# Patient Record
Sex: Male | Born: 1965 | Race: White | Hispanic: No | Marital: Married | Smoking: Never smoker
Health system: Southern US, Community
[De-identification: ages and names within clinical notes are randomized; demographics above are authoritative.]

## PROBLEM LIST (undated history)

## (undated) DIAGNOSIS — E119 Type 2 diabetes mellitus without complications: Secondary | ICD-10-CM

## (undated) DIAGNOSIS — K8020 Calculus of gallbladder without cholecystitis without obstruction: Secondary | ICD-10-CM

## (undated) DIAGNOSIS — I10 Essential (primary) hypertension: Secondary | ICD-10-CM

## (undated) HISTORY — DX: Calculus of gallbladder without cholecystitis without obstruction: K80.20

## (undated) HISTORY — DX: Type 2 diabetes mellitus without complications: E11.9

## (undated) HISTORY — DX: Essential (primary) hypertension: I10

---

## 2003-02-02 DIAGNOSIS — Z86018 Personal history of other benign neoplasm: Secondary | ICD-10-CM

## 2003-02-02 HISTORY — DX: Personal history of other benign neoplasm: Z86.018

## 2004-05-22 ENCOUNTER — Emergency Department (HOSPITAL_COMMUNITY): Admission: EM | Admit: 2004-05-22 | Discharge: 2004-05-22 | Payer: Self-pay | Admitting: *Deleted

## 2009-08-14 DIAGNOSIS — D239 Other benign neoplasm of skin, unspecified: Secondary | ICD-10-CM

## 2009-08-14 HISTORY — DX: Other benign neoplasm of skin, unspecified: D23.9

## 2010-10-19 DIAGNOSIS — Z85828 Personal history of other malignant neoplasm of skin: Secondary | ICD-10-CM

## 2010-10-19 HISTORY — DX: Personal history of other malignant neoplasm of skin: Z85.828

## 2015-03-13 DIAGNOSIS — L821 Other seborrheic keratosis: Secondary | ICD-10-CM | POA: Diagnosis not present

## 2015-03-13 DIAGNOSIS — D1800 Hemangioma unspecified site: Secondary | ICD-10-CM | POA: Diagnosis not present

## 2015-03-13 DIAGNOSIS — Z85828 Personal history of other malignant neoplasm of skin: Secondary | ICD-10-CM | POA: Diagnosis not present

## 2015-03-13 DIAGNOSIS — L918 Other hypertrophic disorders of the skin: Secondary | ICD-10-CM | POA: Diagnosis not present

## 2015-03-13 DIAGNOSIS — L820 Inflamed seborrheic keratosis: Secondary | ICD-10-CM | POA: Diagnosis not present

## 2015-03-13 DIAGNOSIS — D485 Neoplasm of uncertain behavior of skin: Secondary | ICD-10-CM | POA: Diagnosis not present

## 2015-03-13 DIAGNOSIS — D229 Melanocytic nevi, unspecified: Secondary | ICD-10-CM | POA: Diagnosis not present

## 2015-03-13 DIAGNOSIS — Z1283 Encounter for screening for malignant neoplasm of skin: Secondary | ICD-10-CM | POA: Diagnosis not present

## 2015-03-13 DIAGNOSIS — L578 Other skin changes due to chronic exposure to nonionizing radiation: Secondary | ICD-10-CM | POA: Diagnosis not present

## 2015-03-28 DIAGNOSIS — H524 Presbyopia: Secondary | ICD-10-CM | POA: Diagnosis not present

## 2015-12-01 ENCOUNTER — Other Ambulatory Visit: Payer: Self-pay | Admitting: Internal Medicine

## 2015-12-01 DIAGNOSIS — R7989 Other specified abnormal findings of blood chemistry: Secondary | ICD-10-CM

## 2015-12-01 DIAGNOSIS — R945 Abnormal results of liver function studies: Secondary | ICD-10-CM

## 2015-12-05 ENCOUNTER — Ambulatory Visit
Admission: RE | Admit: 2015-12-05 | Discharge: 2015-12-05 | Disposition: A | Discharge status: Home / Self Care | Payer: BLUE CROSS/BLUE SHIELD | Source: Ambulatory Visit | Attending: Internal Medicine | Admitting: Internal Medicine

## 2015-12-05 DIAGNOSIS — R7989 Other specified abnormal findings of blood chemistry: Secondary | ICD-10-CM | POA: Insufficient documentation

## 2015-12-05 DIAGNOSIS — R945 Abnormal results of liver function studies: Secondary | ICD-10-CM

## 2015-12-24 ENCOUNTER — Encounter: Payer: Self-pay | Admitting: *Deleted

## 2015-12-24 ENCOUNTER — Encounter: Payer: BLUE CROSS/BLUE SHIELD | Attending: Internal Medicine | Admitting: *Deleted

## 2015-12-24 VITALS — BP 142/96 | Ht 73.0 in | Wt 267.4 lb

## 2015-12-24 DIAGNOSIS — Z713 Dietary counseling and surveillance: Secondary | ICD-10-CM | POA: Diagnosis present

## 2015-12-24 DIAGNOSIS — E119 Type 2 diabetes mellitus without complications: Secondary | ICD-10-CM | POA: Insufficient documentation

## 2015-12-24 NOTE — Progress Notes (Signed)
Diabetes Self-Management Education  Visit Type: First/Initial  Appt. Start Time: 1030 Appt. End Time: [id number]  12/24/2015  Randy Hahn, identified by name and date of birth, is a 50 y.o. male with a diagnosis of Diabetes: Type 2.   ASSESSMENT  Blood pressure (!) 142/96, height 6\' 1"  (1.854 m), weight 267 lb 6.4 oz (121.3 kg). Body mass index is 35.28 kg/m.      Diabetes Self-Management Education - 12/24/15 1218      Visit Information   Visit Type First/Initial     Initial Visit   Diabetes Type Type 2   Are you currently following a meal plan? Yes   What type of meal plan do you follow? "cutting carbs/sugars"   Are you taking your medications as prescribed? Yes   Date Diagnosed "3 weeks"     Health Coping   How would you rate your overall health? Fair     Psychosocial Assessment   Patient Belief/Attitude about Diabetes Motivated to manage diabetes  "angry"   Self-care barriers None   Self-management support Doctor's office;Family   Patient Concerns Nutrition/Meal planning;Medication;Glycemic Control;Weight Control;Healthy Lifestyle   Special Needs None   Preferred Learning Style Visual   Learning Readiness Change in progress   How often do you need to have someone help you when you read instructions, pamphlets, or other written materials from your doctor or pharmacy? 1 - Never   What is the last grade level you completed in school? Masters     Pre-Education Assessment   Patient understands the diabetes disease and treatment process. Needs Instruction   Patient understands incorporating nutritional management into lifestyle. Needs Instruction   Patient undertands incorporating physical activity into lifestyle. Needs Instruction   Patient understands using medications safely. Needs Instruction   Patient understands monitoring blood glucose, interpreting and using results Needs Review   Patient understands prevention, detection, and treatment of acute  complications. Needs Instruction   Patient understands prevention, detection, and treatment of chronic complications. Needs Instruction   Patient understands how to develop strategies to address psychosocial issues. Needs Instruction   Patient understands how to develop strategies to promote health/change behavior. Needs Instruction     Complications   Last HgB A1C per patient/outside source 11.2 %   How often do you check your blood sugar? 1-2 times/day   Fasting Blood glucose range (mg/dL) 130-179  FBG's 134-174 mg/dL   Postprandial Blood glucose range (mg/dL) 70-129;130-179  pp's 100-178 mg/dL   Have you had a dilated eye exam in the past 12 months? Yes   Have you had a dental exam in the past 12 months? Yes   Are you checking your feet? No     Dietary Intake   Breakfast eggs, cheese, sausage on English muffin   Snack (morning) cereal bars, chips, nuts, fruit   Lunch salad or wrap; protien bars, peanut butter crackers   Snack (afternoon) same as morning snack   Dinner meat, vegetables - green beans   Snack (evening) Greek yogurt   Beverage(s) water, diet soda     Exercise   Exercise Type Light (walking / raking leaves)   How many days per week to you exercise? 3   How many minutes per day do you exercise? 30   Total minutes per week of exercise 90     Patient Education   Previous Diabetes Education No   Disease state  Definition of diabetes, type 1 and 2, and the diagnosis of diabetes   Nutrition management  Role of diet in the treatment of diabetes and the relationship between the three main macronutrients and blood glucose level;Carbohydrate counting   Physical activity and exercise  Role of exercise on diabetes management, blood pressure control and cardiac health.   Medications Reviewed patients medication for diabetes, action, purpose, timing of dose and side effects.   Monitoring Purpose and frequency of SMBG.;Taught/discussed recording of test results and interpretation  of SMBG.;Identified appropriate SMBG and/or A1C goals.   Chronic complications Relationship between chronic complications and blood glucose control   Psychosocial adjustment Identified and addressed patients feelings and concerns about diabetes     Individualized Goals (developed by patient)   Reducing Risk Improve blood sugars Decrease medications Lose weight Lead a healthier lifestyle Become more fit     Outcomes   Expected Outcomes Demonstrated interest in learning. Expect positive outcomes   Future DMSE 4-6 wks      Individualized Plan for Diabetes Self-Management Training:   Learning Objective:  Patient will have a greater understanding of diabetes self-management. Patient education plan is to attend individual and/or group sessions per assessed needs and concerns.   Plan:   Patient Instructions  Check blood sugars 2 x day before breakfast and 2 hrs after supper every day Exercise: Continue walking for  20-30  minutes  3-4 days a week and gradually increase to 150 minutes/week Eat 3 meals day,  2-3  snacks a day Space meals 4-6 hours apart Bring blood sugar records to the next class   Expected Outcomes:  Demonstrated interest in learning. Expect positive outcomes  Education material provided:  General Meal Planning Guidelines Simple Meal Plan  If problems or questions, patient to contact team via:  Johny Drilling, RN, [place], CDE [phone]  Future DSME appointment: 4-6 wks  February 09, 2016 for Diabetes Class 1

## 2015-12-24 NOTE — Patient Instructions (Signed)
Check blood sugars 2 x day before breakfast and 2 hrs after supper every day  Exercise: Continue walking for  20-30  minutes  3-4 days a week and gradually increase to 150 minutes/week  Eat 3 meals day,  2-3  snacks a day Space meals 4-6 hours apart  Bring blood sugar records to the next class  Return for classes on:

## 2016-02-12 ENCOUNTER — Encounter: Payer: BLUE CROSS/BLUE SHIELD | Attending: Internal Medicine | Admitting: Dietician

## 2016-02-12 ENCOUNTER — Encounter: Payer: Self-pay | Admitting: Dietician

## 2016-02-12 VITALS — Ht 73.0 in | Wt 269.4 lb

## 2016-02-12 DIAGNOSIS — Z713 Dietary counseling and surveillance: Secondary | ICD-10-CM | POA: Diagnosis present

## 2016-02-12 DIAGNOSIS — E119 Type 2 diabetes mellitus without complications: Secondary | ICD-10-CM | POA: Diagnosis not present

## 2016-02-12 NOTE — Progress Notes (Signed)

## 2016-02-16 ENCOUNTER — Encounter: Payer: BLUE CROSS/BLUE SHIELD | Admitting: Dietician

## 2016-02-16 ENCOUNTER — Encounter: Payer: Self-pay | Admitting: Dietician

## 2016-02-16 VITALS — Wt 265.3 lb

## 2016-02-16 DIAGNOSIS — E119 Type 2 diabetes mellitus without complications: Secondary | ICD-10-CM

## 2016-02-16 DIAGNOSIS — Z713 Dietary counseling and surveillance: Secondary | ICD-10-CM | POA: Diagnosis not present

## 2016-02-16 NOTE — Progress Notes (Signed)

## 2016-02-25 ENCOUNTER — Telehealth: Payer: Self-pay | Admitting: Dietician

## 2016-02-25 NOTE — Telephone Encounter (Signed)
Called patient to reschedule class 3, which he missed on 02/23/16. Left a voicemail message offering next class 3 on 03/22/16, and requested a call back.

## 2016-03-30 ENCOUNTER — Encounter: Payer: Self-pay | Admitting: *Deleted

## 2018-02-02 IMAGING — US US ABDOMEN LIMITED
1 series · 13 of 25 positions shown · non-contrast
Comparison: None.

CLINICAL DATA: Abnormal liver enzymes

EXAM:
US ABDOMEN LIMITED - RIGHT UPPER QUADRANT

[Series 1: us abdomen limited · 0.24mm/px · 13 of 55 slices shown]
[im 1/55]
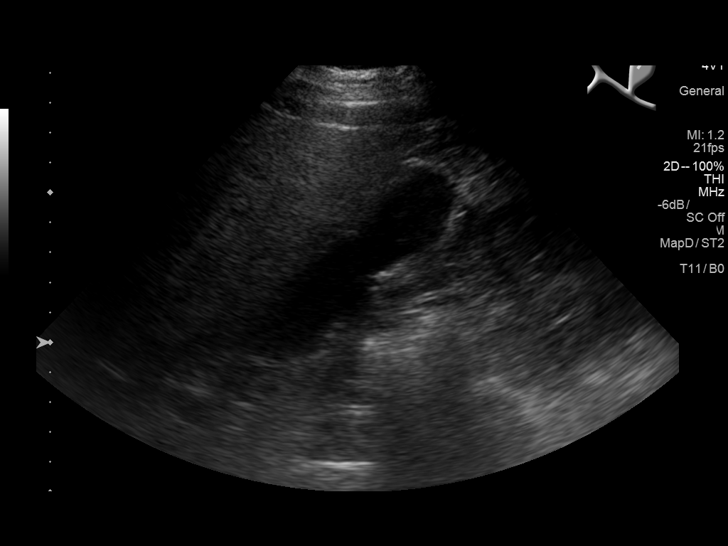
[im 5/55]
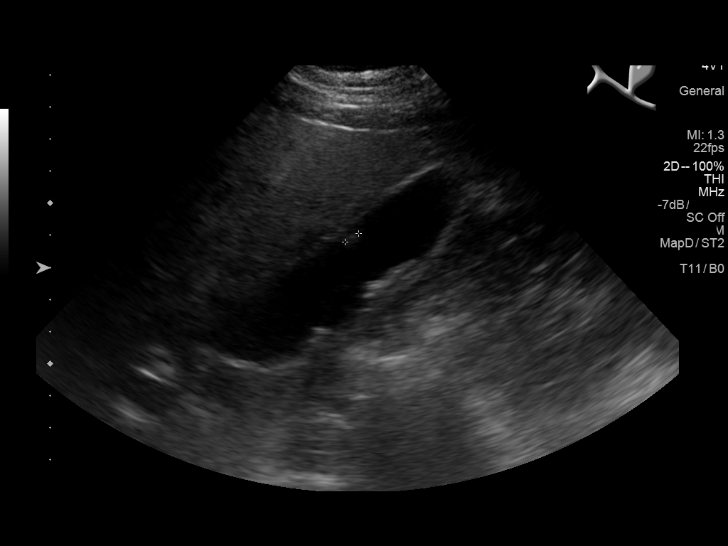
[im 10/55]
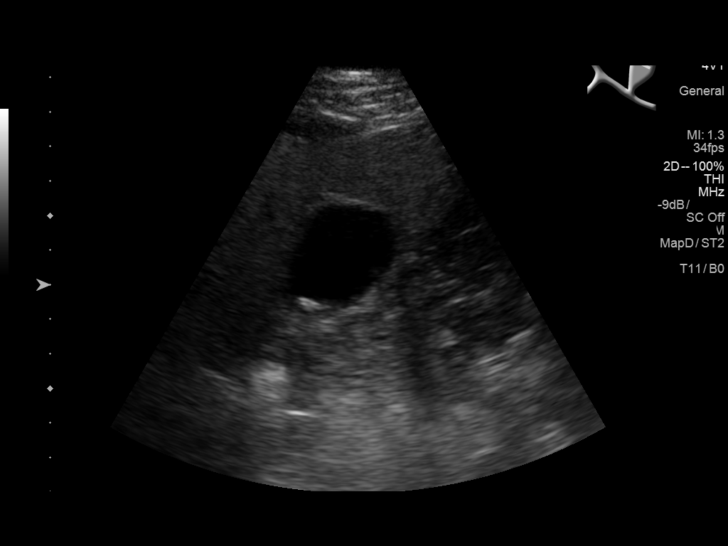
[im 14/55]
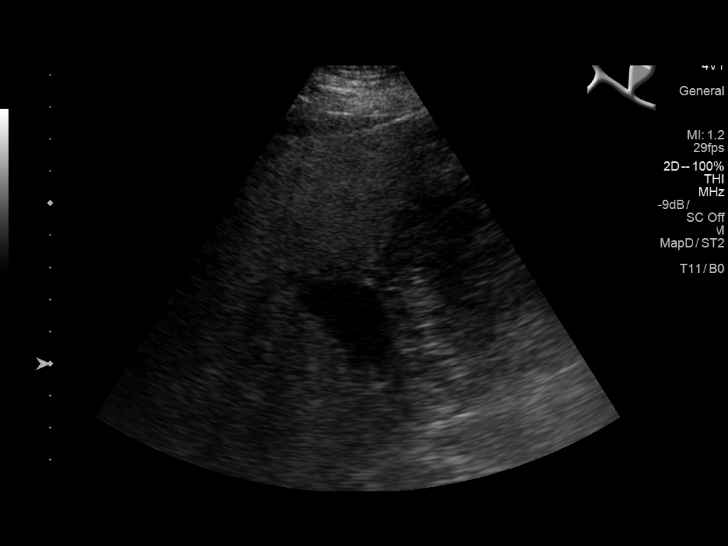
[im 19/55]
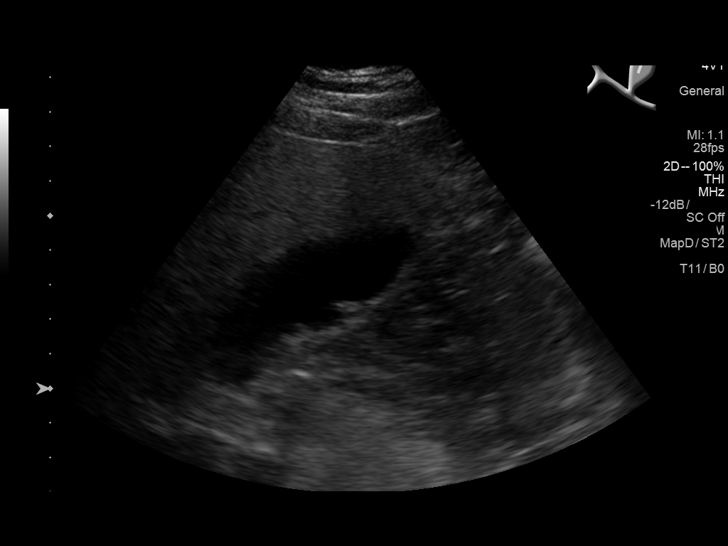
[im 23/55]
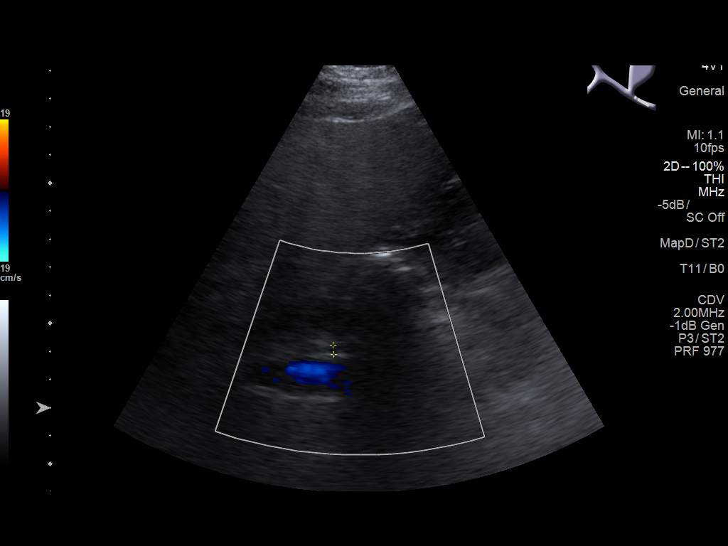
[im 28/55]
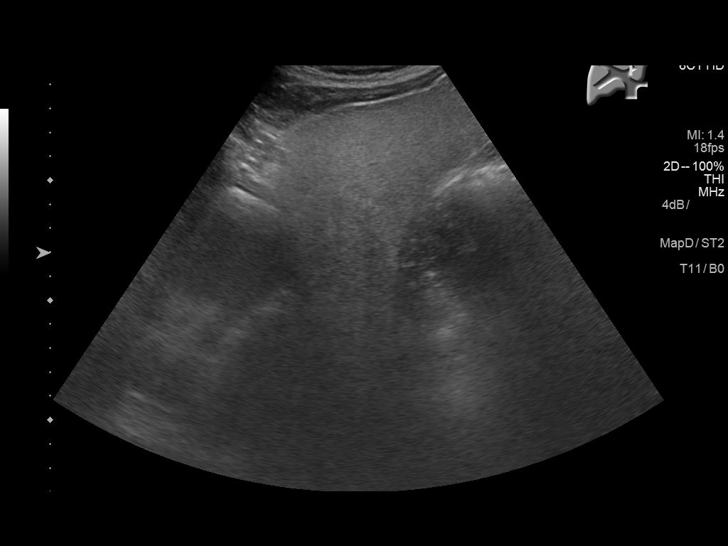
[im 32/55]
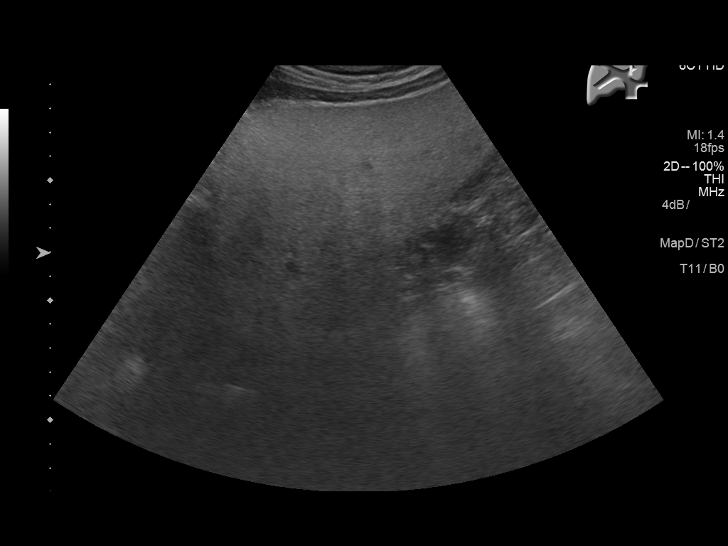
[im 37/55]
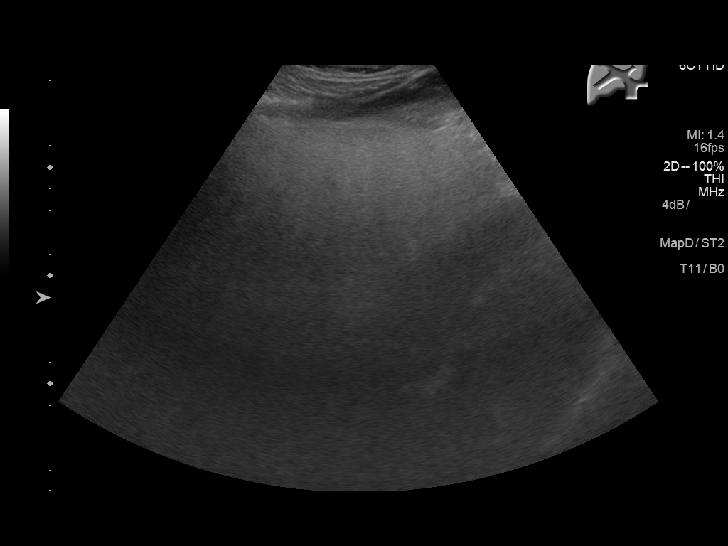
[im 41/55]
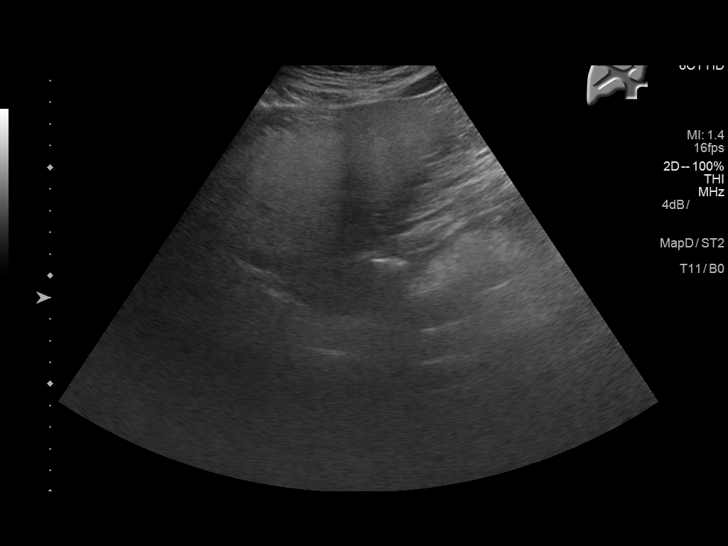
[im 46/55]
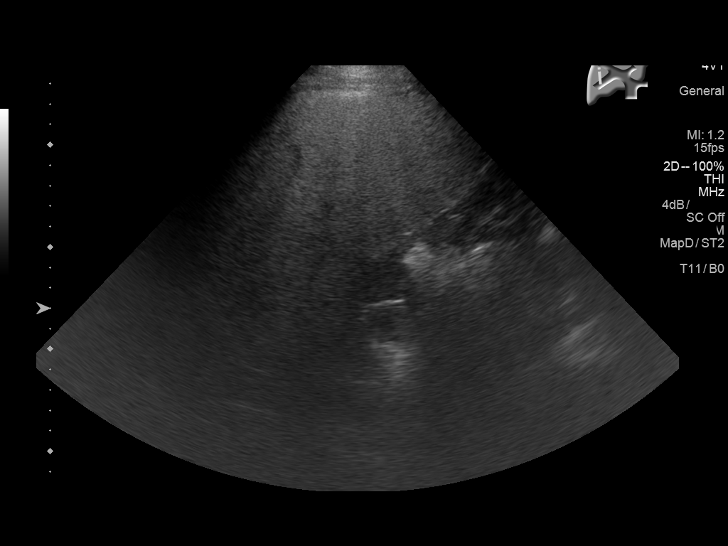
[im 50/55]
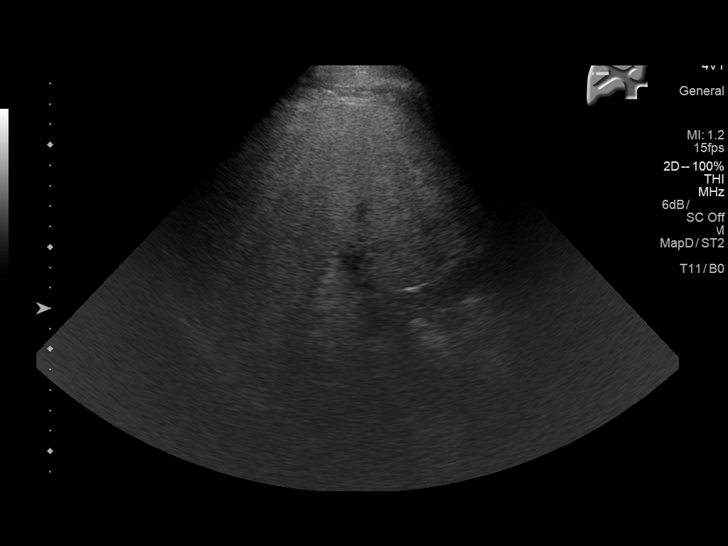
[im 55/55]
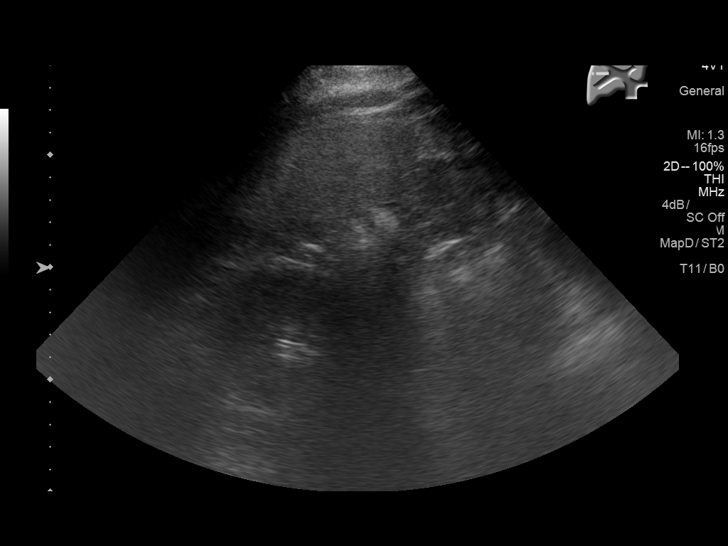

[13 of 25 positions shown; findings below may reference images not displayed]

FINDINGS: Gallbladder:

Within the gallbladder, there is an echogenic focus which shadows,
likely an adherent gallstone. There is a non shadowing echogenic
focus in the gallbladder which does not move measuring 6 mm, felt to
represent a polyp. Several 3-4 mm echogenic foci which do not move
or shadow are also seen, felt to represent polyps. No gallbladder
wall thickening or pericholecystic fluid. No sonographic Murphy sign
noted by sonographer.

Common bile duct:

Diameter: 3 mm. No intrahepatic or extrahepatic biliary duct
dilatation.

Liver:

No focal lesion identified. There is diffusely increased liver
echogenicity.
IMPRESSION: Several polypoid areas in the gallbladder, largest measuring 6 mm.
There is also a probable adherent gallstone in the gallbladder. The
gallbladder wall is not appreciably thickened. Based on polyp size,
specific recommendation is not made per consensus guidelines.

There is marked increased echogenicity in the liver, likely due to
diffuse hepatic steatosis. While no focal liver lesions are
identified, it must be cautioned that the sensitivity of ultrasound
for focal liver lesions is diminished in this circumstance.

## 2018-02-14 DIAGNOSIS — C439 Malignant melanoma of skin, unspecified: Secondary | ICD-10-CM

## 2018-02-14 DIAGNOSIS — Z85820 Personal history of malignant melanoma of skin: Secondary | ICD-10-CM

## 2018-02-14 HISTORY — DX: Personal history of malignant melanoma of skin: Z85.820

## 2018-02-14 HISTORY — DX: Malignant melanoma of skin, unspecified: C43.9

## 2019-06-11 ENCOUNTER — Other Ambulatory Visit: Payer: Self-pay

## 2019-06-11 ENCOUNTER — Encounter: Payer: Self-pay | Admitting: Dermatology

## 2019-06-11 ENCOUNTER — Ambulatory Visit (INDEPENDENT_AMBULATORY_CARE_PROVIDER_SITE_OTHER): Payer: BC Managed Care – PPO | Admitting: Dermatology

## 2019-06-11 DIAGNOSIS — D229 Melanocytic nevi, unspecified: Secondary | ICD-10-CM

## 2019-06-11 DIAGNOSIS — G629 Polyneuropathy, unspecified: Secondary | ICD-10-CM

## 2019-06-11 DIAGNOSIS — L814 Other melanin hyperpigmentation: Secondary | ICD-10-CM

## 2019-06-11 DIAGNOSIS — L821 Other seborrheic keratosis: Secondary | ICD-10-CM

## 2019-06-11 DIAGNOSIS — L309 Dermatitis, unspecified: Secondary | ICD-10-CM | POA: Diagnosis not present

## 2019-06-11 DIAGNOSIS — Z1283 Encounter for screening for malignant neoplasm of skin: Secondary | ICD-10-CM | POA: Diagnosis not present

## 2019-06-11 DIAGNOSIS — L910 Hypertrophic scar: Secondary | ICD-10-CM

## 2019-06-11 DIAGNOSIS — Z85820 Personal history of malignant melanoma of skin: Secondary | ICD-10-CM

## 2019-06-11 DIAGNOSIS — E119 Type 2 diabetes mellitus without complications: Secondary | ICD-10-CM

## 2019-06-11 DIAGNOSIS — Z86018 Personal history of other benign neoplasm: Secondary | ICD-10-CM

## 2019-06-11 DIAGNOSIS — L578 Other skin changes due to chronic exposure to nonionizing radiation: Secondary | ICD-10-CM

## 2019-06-11 DIAGNOSIS — D2239 Melanocytic nevi of other parts of face: Secondary | ICD-10-CM

## 2019-06-11 MED ORDER — MOMETASONE FUROATE 0.1 % EX CREA: TOPICAL_CREAM | CUTANEOUS | 1 refills | Status: DC

## 2019-06-11 NOTE — Progress Notes (Signed)
   Follow-Up Visit   Subjective  Randy Hahn is a 54 y.o. male who presents for the following: Annual Exam (Hx MM, and dysplastic nevi) and rash (itchy dry patches on the stomach and back - patient using OTC lotions). The patient presents for total-body skin exam for skin cancer screening and mole check.  The following portions of the chart were reviewed this encounter and updated as appropriate:  Tobacco  Allergies  Meds  Problems  Med Hx  Surg Hx  Fam Hx     Review of Systems:  No other skin or systemic complaints except as noted in HPI or Assessment and Plan.  Objective  Well appearing patient in no apparent distress; mood and affect are within normal limits.  A full examination was performed including scalp, head, eyes, ears, nose, lips, neck, chest, axillae, abdomen, back, buttocks, bilateral upper extremities, bilateral lower extremities, hands, feet, fingers, toes, fingernails, and toenails. All findings within normal limits unless otherwise noted below.  Objective  Trunk: Patchy pinkness and dyschromia mostly on the abdomen   Objective  back: Dyspigmented smooth macule or patch.   Objective  dorsum nose: 0.3 cm flesh colored papule   Assessment & Plan  Eczema, unspecified type Trunk  Recommend mild soaps and moisturizers like Dove and CeraVe cream. Start Mometasone 0.1% cream apply to aa's QD-BID PRN.   mometasone (ELOCON) 0.1 % cream - Trunk  Hypertrophic scar back  S/P Cyst removal - about 20 years ago from a general surgeon  Neuropathy R toe  Patient is type 2 diabetic, not related to MM surgery - patient to follow up with Dr. Sabra Heck in June.   Nevus dorsum nose  Vs fibrous papule - Benign, observe.     Seborrheic Keratoses - Stuck-on, waxy, tan-brown papules and plaques  - Discussed benign etiology and prognosis. - Observe - Call for any changes  Lentigines - Scattered tan macules - Discussed due to sun exposure - Benign,  observe - Call for any changes  Actinic Damage - diffuse scaly erythematous macules with underlying dyspigmentation - Recommend daily broad spectrum sunscreen SPF 30+ to sun-exposed areas, reapply every 2 hours as needed.  - Call for new or changing lesions.  Hemangiomas - Red papules - Discussed benign nature - Observe - Call for any changes  Melanocytic Nevi - Tan-brown and/or pink-flesh-colored symmetric macules and papules - Benign appearing on exam today - Observation - Call clinic for new or changing moles - Recommend daily use of broad spectrum spf 30+ sunscreen to sun-exposed areas.   History of Melanoma - No evidence of recurrence today - No lymphadenopathy - Recommend regular full body skin exams - Recommend daily broad spectrum sunscreen SPF 30+ to sun-exposed areas, reapply every 2 hours as needed.  - Call if any new or changing lesions are noted between office visits  Skin cancer screening performed today.  Return in about 4 months (around 10/12/2019) for TBSE.  [name], CMA, am acting as scribe for [name], MD . Documentation: I have reviewed the above documentation for accuracy and completeness, and I agree with the above.  [name], MD

## 2019-06-11 NOTE — Patient Instructions (Signed)

## 2019-06-26 ENCOUNTER — Encounter: Payer: Self-pay | Admitting: Dermatology

## 2019-10-04 ENCOUNTER — Ambulatory Visit (INDEPENDENT_AMBULATORY_CARE_PROVIDER_SITE_OTHER): Payer: No Typology Code available for payment source | Admitting: Dermatology

## 2019-10-04 ENCOUNTER — Other Ambulatory Visit: Payer: Self-pay

## 2019-10-04 ENCOUNTER — Encounter: Payer: Self-pay | Admitting: Dermatology

## 2019-10-04 DIAGNOSIS — L821 Other seborrheic keratosis: Secondary | ICD-10-CM

## 2019-10-04 DIAGNOSIS — D225 Melanocytic nevi of trunk: Secondary | ICD-10-CM | POA: Diagnosis not present

## 2019-10-04 DIAGNOSIS — L578 Other skin changes due to chronic exposure to nonionizing radiation: Secondary | ICD-10-CM

## 2019-10-04 DIAGNOSIS — Z1283 Encounter for screening for malignant neoplasm of skin: Secondary | ICD-10-CM

## 2019-10-04 DIAGNOSIS — Z85828 Personal history of other malignant neoplasm of skin: Secondary | ICD-10-CM

## 2019-10-04 DIAGNOSIS — R21 Rash and other nonspecific skin eruption: Secondary | ICD-10-CM | POA: Diagnosis not present

## 2019-10-04 DIAGNOSIS — Z85820 Personal history of malignant melanoma of skin: Secondary | ICD-10-CM

## 2019-10-04 DIAGNOSIS — D229 Melanocytic nevi, unspecified: Secondary | ICD-10-CM

## 2019-10-04 DIAGNOSIS — D1800 Hemangioma unspecified site: Secondary | ICD-10-CM

## 2019-10-04 DIAGNOSIS — L814 Other melanin hyperpigmentation: Secondary | ICD-10-CM

## 2019-10-04 NOTE — Progress Notes (Signed)
   Follow-Up Visit   Subjective  Randy Hahn is a 54 y.o. male who presents for the following: Annual Exam (Hx MM, dysplastic nevi - patient has a rash that started last week after working in the yard. He is using Mometasone cream which does seem to help).  The following portions of the chart were reviewed this encounter and updated as appropriate:  Tobacco  Allergies  Meds  Problems  Med Hx  Surg Hx  Fam Hx     Review of Systems:  No other skin or systemic complaints except as noted in HPI or Assessment and Plan.  Objective  Well appearing patient in no apparent distress; mood and affect are within normal limits.  A full examination was performed including scalp, head, eyes, ears, nose, lips, neck, chest, axillae, abdomen, back, buttocks, bilateral upper extremities, bilateral lower extremities, hands, feet, fingers, toes, fingernails, and toenails. All findings within normal limits unless otherwise noted below.  Objective  R post waistline: Re-pigmented brown macule  Assessment & Plan  Rash and other nonspecific skin eruption - Eczema vs Contact Dermatitis - flared Right Forearm - Anterior Eczema (vs Contact) of the right arm with flare - continue Mometasone 0.1% cream to aa BID until clear.   Nevus R post waistline repigmented Biopsy proven benign - 07/20/2018   Lentigines - Scattered tan macules - Discussed due to sun exposure - Benign, observe - Call for any changes  Seborrheic Keratoses - Stuck-on, waxy, tan-brown papules and plaques  - Discussed benign etiology and prognosis. - Observe - Call for any changes  Melanocytic Nevi - Tan-brown and/or pink-flesh-colored symmetric macules and papules - Benign appearing on exam today - Observation - Call clinic for new or changing moles - Recommend daily use of broad spectrum spf 30+ sunscreen to sun-exposed areas.   Hemangiomas - Red papules - Discussed benign nature - Observe - Call for any  changes  Actinic Damage - diffuse scaly erythematous macules with underlying dyspigmentation - Recommend daily broad spectrum sunscreen SPF 30+ to sun-exposed areas, reapply every 2 hours as needed.  - Call for new or changing lesions.  History of Melanoma with superficial spreading - R med ant ankle above the med malleous (02/14/2018)  - No evidence of recurrence today - No lymphadenopathy - Recommend regular full body skin exams - Recommend daily broad spectrum sunscreen SPF 30+ to sun-exposed areas, reapply every 2 hours as needed.  - Call if any new or changing lesions are noted between office visits  History of Basal Cell Carcinoma of the Skin - R lat deltoid (10/19/2010) - No evidence of recurrence today - Recommend regular full body skin exams - Recommend daily broad spectrum sunscreen SPF 30+ to sun-exposed areas, reapply every 2 hours as needed.  - Call if any new or changing lesions are noted between office visits  Skin cancer screening performed today.  Return in about 4 months (around 02/03/2020) for TBSE - hx MM, dysplastic nevi .  [name], CMA, am acting as scribe for [name], MD .  Documentation: I have reviewed the above documentation for accuracy and completeness, and I agree with the above.  [name], MD

## 2019-10-10 ENCOUNTER — Encounter: Payer: Self-pay | Admitting: Dermatology

## 2020-02-05 ENCOUNTER — Ambulatory Visit (INDEPENDENT_AMBULATORY_CARE_PROVIDER_SITE_OTHER): Payer: No Typology Code available for payment source | Admitting: Dermatology

## 2020-02-05 ENCOUNTER — Encounter: Payer: Self-pay | Admitting: Dermatology

## 2020-02-05 ENCOUNTER — Other Ambulatory Visit: Payer: Self-pay

## 2020-02-05 DIAGNOSIS — Z85820 Personal history of malignant melanoma of skin: Secondary | ICD-10-CM

## 2020-02-05 DIAGNOSIS — L810 Postinflammatory hyperpigmentation: Secondary | ICD-10-CM | POA: Diagnosis not present

## 2020-02-05 DIAGNOSIS — Z1283 Encounter for screening for malignant neoplasm of skin: Secondary | ICD-10-CM

## 2020-02-05 DIAGNOSIS — Z85828 Personal history of other malignant neoplasm of skin: Secondary | ICD-10-CM

## 2020-02-05 DIAGNOSIS — D1800 Hemangioma unspecified site: Secondary | ICD-10-CM

## 2020-02-05 DIAGNOSIS — Z86018 Personal history of other benign neoplasm: Secondary | ICD-10-CM

## 2020-02-05 DIAGNOSIS — L720 Epidermal cyst: Secondary | ICD-10-CM | POA: Diagnosis not present

## 2020-02-05 DIAGNOSIS — L814 Other melanin hyperpigmentation: Secondary | ICD-10-CM

## 2020-02-05 DIAGNOSIS — L578 Other skin changes due to chronic exposure to nonionizing radiation: Secondary | ICD-10-CM

## 2020-02-05 DIAGNOSIS — L820 Inflamed seborrheic keratosis: Secondary | ICD-10-CM | POA: Diagnosis not present

## 2020-02-05 DIAGNOSIS — D229 Melanocytic nevi, unspecified: Secondary | ICD-10-CM

## 2020-02-05 DIAGNOSIS — L821 Other seborrheic keratosis: Secondary | ICD-10-CM

## 2020-02-05 NOTE — Progress Notes (Signed)
Follow-Up Visit   Subjective  Randy Hahn is a 55 y.o. male who presents for the following: Annual Exam (Hx MM, Hx BCC, Hx dysplastic nevi). Patient has noticed a lesion on his R lower leg that he scratches at, and a hx of an inflamed cyst that he would like checked today.  The patient presents for Total-Body Skin Exam (TBSE) for skin cancer screening and mole check.  The following portions of the chart were reviewed this encounter and updated as appropriate:   Tobacco  Allergies  Meds  Problems  Med Hx  Surg Hx  Fam Hx     Review of Systems:  No other skin or systemic complaints except as noted in HPI or Assessment and Plan.  Objective  Well appearing patient in no apparent distress; mood and affect are within normal limits.  A full examination was performed including scalp, head, eyes, ears, nose, lips, neck, chest, axillae, abdomen, back, buttocks, bilateral upper extremities, bilateral lower extremities, hands, feet, fingers, toes, fingernails, and toenails. All findings within normal limits unless otherwise noted below.  Objective  R lower leg x 2 (2): Erythematous keratotic or waxy stuck-on papule or plaque.   Objective  R ant waistline: Violaceous macule.  Assessment & Plan  Inflamed seborrheic keratosis (2) R lower leg x 2  Destruction of lesion - R lower leg x 2 Complexity: simple   Destruction method: cryotherapy   Informed consent: discussed and consent obtained   Timeout:  patient name, date of birth, surgical site, and procedure verified Lesion destroyed using liquid nitrogen: Yes   Region frozen until ice ball extended beyond lesion: Yes   Outcome: patient tolerated procedure well with no complications   Post-procedure details: wound care instructions given    Ruptured epidermal cyst R ant waistline  With PIPA - Discussed risk of recurrence and surgery if necessary.   Lentigines - Scattered tan macules - Discussed due to sun exposure -  Benign, observe - Call for any changes  Seborrheic Keratoses - Stuck-on, waxy, tan-brown papules and plaques  - Discussed benign etiology and prognosis. - Observe - Call for any changes  Melanocytic Nevi - Tan-brown and/or pink-flesh-colored symmetric macules and papules - Benign appearing on exam today - Observation - Call clinic for new or changing moles - Recommend daily use of broad spectrum spf 30+ sunscreen to sun-exposed areas.   Hemangiomas - Red papules - Discussed benign nature - Observe - Call for any changes  Actinic Damage - Chronic, secondary to cumulative UV/sun exposure - diffuse scaly erythematous macules with underlying dyspigmentation - Recommend daily broad spectrum sunscreen SPF 30+ to sun-exposed areas, reapply every 2 hours as needed.  - Call for new or changing lesions.  History of Basal Cell Carcinoma of the Skin - R lat deltoid (2012) - No evidence of recurrence today - Recommend regular full body skin exams - Recommend daily broad spectrum sunscreen SPF 30+ to sun-exposed areas, reapply every 2 hours as needed.  - Call if any new or changing lesions are noted between office visits  History of Melanoma - R med ant ankle above the med malleous. Superficial spreading. Clark's level II/III. Breslow's 0.51mm (02/14/2018) - No evidence of recurrence today - No lymphadenopathy - Recommend regular full body skin exams - Recommend daily broad spectrum sunscreen SPF 30+ to sun-exposed areas, reapply every 2 hours as needed.  - Call if any new or changing lesions are noted between office visits  History of Dysplastic Nevi - numerous -  No evidence of recurrence today - Recommend regular full body skin exams - Recommend daily broad spectrum sunscreen SPF 30+ to sun-exposed areas, reapply every 2 hours as needed.  - Call if any new or changing lesions are noted between office visits  Skin cancer screening performed today.  Return in about 6 months (around  08/04/2020) for TBSE - hx MM, dysplastic nevi, BCC.  Maylene Roes, CMA, am acting as scribe for Armida Sans, MD .  Documentation: I have reviewed the above documentation for accuracy and completeness, and I agree with the above.  Armida Sans, MD

## 2020-02-05 NOTE — Patient Instructions (Signed)
Recommend taking Heliocare sun protection supplement daily in sunny weather for additional sun protection. For maximum protection on the sunniest days, you can take up to 2 capsules of regular Heliocare OR take 1 capsule of Heliocare Ultra. For prolonged exposure (such as a full day in the sun), you can repeat your dose of the supplement 4 hours after your first dose. Heliocare can be purchased at [facility] or at [email or web address].       

## 2020-04-29 ENCOUNTER — Other Ambulatory Visit: Payer: Self-pay | Admitting: Dermatology

## 2020-04-29 DIAGNOSIS — L309 Dermatitis, unspecified: Secondary | ICD-10-CM

## 2020-08-20 ENCOUNTER — Ambulatory Visit (INDEPENDENT_AMBULATORY_CARE_PROVIDER_SITE_OTHER): Payer: No Typology Code available for payment source | Admitting: Dermatology

## 2020-08-20 ENCOUNTER — Other Ambulatory Visit: Payer: Self-pay

## 2020-08-20 DIAGNOSIS — Z1283 Encounter for screening for malignant neoplasm of skin: Secondary | ICD-10-CM | POA: Diagnosis not present

## 2020-08-20 DIAGNOSIS — L814 Other melanin hyperpigmentation: Secondary | ICD-10-CM

## 2020-08-20 DIAGNOSIS — Z86018 Personal history of other benign neoplasm: Secondary | ICD-10-CM

## 2020-08-20 DIAGNOSIS — D1800 Hemangioma unspecified site: Secondary | ICD-10-CM

## 2020-08-20 DIAGNOSIS — L821 Other seborrheic keratosis: Secondary | ICD-10-CM

## 2020-08-20 DIAGNOSIS — D229 Melanocytic nevi, unspecified: Secondary | ICD-10-CM

## 2020-08-20 DIAGNOSIS — Z85820 Personal history of malignant melanoma of skin: Secondary | ICD-10-CM | POA: Diagnosis not present

## 2020-08-20 DIAGNOSIS — D2239 Melanocytic nevi of other parts of face: Secondary | ICD-10-CM

## 2020-08-20 DIAGNOSIS — Z85828 Personal history of other malignant neoplasm of skin: Secondary | ICD-10-CM | POA: Diagnosis not present

## 2020-08-20 DIAGNOSIS — L578 Other skin changes due to chronic exposure to nonionizing radiation: Secondary | ICD-10-CM

## 2020-08-20 NOTE — Patient Instructions (Signed)

## 2020-08-20 NOTE — Progress Notes (Signed)
Follow-Up Visit   Subjective  Randy Hahn is a 55 y.o. male who presents for the following: Annual Exam (Hx MM, BCC, dysplastic nevi - patient states that he has noticed no new or changing moles, lesions, or spots.). The patient presents for Total-Body Skin Exam (TBSE) for skin cancer screening and mole check.  The following portions of the chart were reviewed this encounter and updated as appropriate:   Tobacco  Allergies  Meds  Problems  Med Hx  Surg Hx  Fam Hx     Review of Systems:  No other skin or systemic complaints except as noted in HPI or Assessment and Plan.  Objective  Well appearing patient in no apparent distress; mood and affect are within normal limits.  A full examination was performed including scalp, head, eyes, ears, nose, lips, neck, chest, axillae, abdomen, back, buttocks, bilateral upper extremities, bilateral lower extremities, hands, feet, fingers, toes, fingernails, and toenails. All findings within normal limits unless otherwise noted below.  Mid dorsum nose 0.4 cm flesh colored papule.   Assessment & Plan  Fibrous papule of nose Mid dorsum nose Vs nevus -present for many years without change.   Benign-appearing.  Observation.  Call clinic for new or changing lesions.  Recommend daily use of broad spectrum spf 30+ sunscreen to sun-exposed areas.   Lentigines - Scattered tan macules - Due to sun exposure - Benign-appering, observe - Recommend daily broad spectrum sunscreen SPF 30+ to sun-exposed areas, reapply every 2 hours as needed. - Call for any changes  Seborrheic Keratoses - Stuck-on, waxy, tan-brown papules and/or plaques  - Benign-appearing - Discussed benign etiology and prognosis. - Observe - Call for any changes  Melanocytic Nevi - Tan-brown and/or pink-flesh-colored symmetric macules and papules - Benign appearing on exam today - Observation - Call clinic for new or changing moles - Recommend daily use of broad  spectrum spf 30+ sunscreen to sun-exposed areas.   Hemangiomas - Red papules - Discussed benign nature - Observe - Call for any changes  Actinic Damage - Chronic condition, secondary to cumulative UV/sun exposure - diffuse scaly erythematous macules with underlying dyspigmentation - Recommend daily broad spectrum sunscreen SPF 30+ to sun-exposed areas, reapply every 2 hours as needed.  - Staying in the shade or wearing long sleeves, sun glasses (UVA+UVB protection) and wide brim hats (4-inch brim around the entire circumference of the hat) are also recommended for sun protection.  - Call for new or changing lesions.  History of Melanoma - No evidence of recurrence today - No lymphadenopathy - Recommend regular full body skin exams - Recommend daily broad spectrum sunscreen SPF 30+ to sun-exposed areas, reapply every 2 hours as needed.  - Call if any new or changing lesions are noted between office visits  History of Basal Cell Carcinoma of the Skin - No evidence of recurrence today - Recommend regular full body skin exams - Recommend daily broad spectrum sunscreen SPF 30+ to sun-exposed areas, reapply every 2 hours as needed.  - Call if any new or changing lesions are noted between office visits  History of Dysplastic Nevi - No evidence of recurrence today - Recommend regular full body skin exams - Recommend daily broad spectrum sunscreen SPF 30+ to sun-exposed areas, reapply every 2 hours as needed.  - Call if any new or changing lesions are noted between office visits  Skin cancer screening performed today.  Return in about 6 months (around 02/20/2021) for TBSE.  [name], CMA, am  acting as scribe for [name], MD . Documentation: I have reviewed the above documentation for accuracy and completeness, and I agree with the above.  [name], MD

## 2020-08-21 ENCOUNTER — Encounter: Payer: Self-pay | Admitting: Dermatology

## 2021-02-25 ENCOUNTER — Ambulatory Visit (INDEPENDENT_AMBULATORY_CARE_PROVIDER_SITE_OTHER): Payer: No Typology Code available for payment source | Admitting: Dermatology

## 2021-02-25 ENCOUNTER — Other Ambulatory Visit: Payer: Self-pay

## 2021-02-25 DIAGNOSIS — I83811 Varicose veins of right lower extremities with pain: Secondary | ICD-10-CM

## 2021-02-25 DIAGNOSIS — L821 Other seborrheic keratosis: Secondary | ICD-10-CM

## 2021-02-25 DIAGNOSIS — L820 Inflamed seborrheic keratosis: Secondary | ICD-10-CM

## 2021-02-25 DIAGNOSIS — L578 Other skin changes due to chronic exposure to nonionizing radiation: Secondary | ICD-10-CM

## 2021-02-25 DIAGNOSIS — D239 Other benign neoplasm of skin, unspecified: Secondary | ICD-10-CM

## 2021-02-25 DIAGNOSIS — L918 Other hypertrophic disorders of the skin: Secondary | ICD-10-CM

## 2021-02-25 DIAGNOSIS — D2330 Other benign neoplasm of skin of unspecified part of face: Secondary | ICD-10-CM

## 2021-02-25 DIAGNOSIS — D229 Melanocytic nevi, unspecified: Secondary | ICD-10-CM

## 2021-02-25 DIAGNOSIS — D2339 Other benign neoplasm of skin of other parts of face: Secondary | ICD-10-CM | POA: Diagnosis not present

## 2021-02-25 DIAGNOSIS — D1800 Hemangioma unspecified site: Secondary | ICD-10-CM

## 2021-02-25 DIAGNOSIS — Z85828 Personal history of other malignant neoplasm of skin: Secondary | ICD-10-CM

## 2021-02-25 DIAGNOSIS — Z1283 Encounter for screening for malignant neoplasm of skin: Secondary | ICD-10-CM

## 2021-02-25 DIAGNOSIS — Z86018 Personal history of other benign neoplasm: Secondary | ICD-10-CM

## 2021-02-25 DIAGNOSIS — D2372 Other benign neoplasm of skin of left lower limb, including hip: Secondary | ICD-10-CM

## 2021-02-25 DIAGNOSIS — Z85820 Personal history of malignant melanoma of skin: Secondary | ICD-10-CM

## 2021-02-25 DIAGNOSIS — L814 Other melanin hyperpigmentation: Secondary | ICD-10-CM

## 2021-02-25 NOTE — Progress Notes (Signed)
Follow-Up Visit   Subjective  Randy Hahn is a 56 y.o. male who presents for the following: Total body skin exam (Hx of Melanoma R med ant ankle above the med malleous, hx of Dysplastic nevi, hx of BCC R lat deltoid) and check spots (L knee, waistline, painful). The patient presents for Total-Body Skin Exam (TBSE) for skin cancer screening and mole check.  The patient has spots, moles and lesions to be evaluated, some may be new or changing and the patient has concerns that these could be cancer.   The following portions of the chart were reviewed this encounter and updated as appropriate:   Tobacco   Allergies   Meds   Problems   Med Hx   Surg Hx   Fam Hx      Review of Systems:  No other skin or systemic complaints except as noted in HPI or Assessment and Plan.  Objective  Well appearing patient in no apparent distress; mood and affect are within normal limits.  A full examination was performed including scalp, head, eyes, ears, nose, lips, neck, chest, axillae, abdomen, back, buttocks, bilateral upper extremities, bilateral lower extremities, hands, feet, fingers, toes, fingernails, and toenails. All findings within normal limits unless otherwise noted below.  R med ant ankle above the med malleous Well healed scar with no evidence of recurrence, no lymphadenopathy.   mid dorsum nose 0.4cm flesh pap  R calf Varicose vein  Left Lower Leg Firm pink/brown papulenodule with dimple sign.   L med knee x 2, L ant thigh x 1, L forearm x 1 (4) Stuck on waxy paps with erythema  Assessment & Plan   Lentigines - Scattered tan macules - Due to sun exposure - Benign-appearing, observe - Recommend daily broad spectrum sunscreen SPF 30+ to sun-exposed areas, reapply every 2 hours as needed. - Call for any changes  Seborrheic Keratoses - Stuck-on, waxy, tan-brown papules and/or plaques  - Benign-appearing - Discussed benign etiology and prognosis. - Observe - Call for any  changes  Melanocytic Nevi - Tan-brown and/or pink-flesh-colored symmetric macules and papules - Benign appearing on exam today - Observation - Call clinic for new or changing moles - Recommend daily use of broad spectrum spf 30+ sunscreen to sun-exposed areas.   Hemangiomas - Red papules - Discussed benign nature - Observe - Call for any changes  Actinic Damage - Chronic condition, secondary to cumulative UV/sun exposure - diffuse scaly erythematous macules with underlying dyspigmentation - Recommend daily broad spectrum sunscreen SPF 30+ to sun-exposed areas, reapply every 2 hours as needed.  - Staying in the shade or wearing long sleeves, sun glasses (UVA+UVB protection) and wide brim hats (4-inch brim around the entire circumference of the hat) are also recommended for sun protection.  - Call for new or changing lesions.  Skin cancer screening performed today.  History of Basal Cell Carcinoma of the Skin - No evidence of recurrence today - Recommend regular full body skin exams - Recommend daily broad spectrum sunscreen SPF 30+ to sun-exposed areas, reapply every 2 hours as needed.  - Call if any new or changing lesions are noted between office visits  - R lat deltoid  History of Dysplastic Nevi - No evidence of recurrence today - Recommend regular full body skin exams - Recommend daily broad spectrum sunscreen SPF 30+ to sun-exposed areas, reapply every 2 hours as needed.  - Call if any new or changing lesions are noted between office visits  - R lat  upper back, L mid to sup scapula, R lat costal margin near side,   Acrochordons (Skin Tags) - Fleshy, skin-colored pedunculated papules - Benign appearing.  - Observe. - If desired, they can be removed with an in office procedure that is not covered by insurance. - Please call the clinic if you notice any new or changing lesions.   History of melanoma R med ant ankle above the med malleous  Clark's level II/III.  Breslow's 0.33mm. Excised 02/14/2018  Clear. Observe for recurrence. Call clinic for new or changing lesions.  Recommend regular skin exams, daily broad-spectrum spf 30+ sunscreen use, and photoprotection.    Fibrous papule of face mid dorsum nose  Vs Nevus, Benign-appearing.  Observation.  Call clinic for new or changing moles.  Recommend daily use of broad spectrum spf 30+ sunscreen to sun-exposed areas.    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    Dermatofibroma Left Lower Leg  Benign, observe.    Inflamed seborrheic keratosis (4) L med knee x 2, L ant thigh x 1, L forearm x 1  Destruction of lesion - L med knee x 2, L ant thigh x 1, L forearm x 1 Complexity: simple   Destruction method: cryotherapy   Informed consent: discussed and consent obtained   Timeout:  patient name, date of birth, surgical site, and procedure verified Lesion destroyed using liquid nitrogen: Yes   Region frozen until ice ball extended beyond lesion: Yes   Outcome: patient tolerated procedure well with no complications   Post-procedure details: wound care instructions given    Skin cancer screening   Return in about 6 months (around 08/25/2021) for TBSE, Hx of Melanoma, Hx of BCC, Hx of Dysplastic nevi.  I, Randy Hahn, RMA, am acting as scribe for Randy Ser, MD . Documentation: I have reviewed the above documentation for accuracy and completeness, and I agree with the above.  Randy Ser, MD

## 2021-02-25 NOTE — Patient Instructions (Addendum)
If You Need Anything After Your Visit  If you have any questions or concerns for your doctor, please call our main line at [phone] and press option 4 to reach your doctor's medical assistant. If no one answers, please leave a voicemail as directed and we will return your call as soon as possible. Messages left after 4 pm will be answered the following business day.   You may also send us a message via MyChart. We typically respond to MyChart messages within 1-2 business days.  For prescription refills, please ask your pharmacy to contact our office. Our fax number is [phone].  If you have an urgent issue when the clinic is closed that cannot wait until the next business day, you can page your doctor at the number below.    Please note that while we do our best to be available for urgent issues outside of office hours, we are not available 24/7.   If you have an urgent issue and are unable to reach us, you may choose to seek medical care at your doctor's office, retail clinic, urgent care center, or emergency room.  If you have a medical emergency, please immediately call 911 or go to the emergency department.  Pager Numbers  - Dr. Kowalski: [phone]  - Dr. Moye: [phone]  - Dr. Stewart: [phone]  In the event of inclement weather, please call our main line at [phone] for an update on the status of any delays or closures.  Dermatology Medication Tips: Please keep the boxes that topical medications come in in order to help keep track of the instructions about where and how to use these. Pharmacies typically print the medication instructions only on the boxes and not directly on the medication tubes.   If your medication is too expensive, please contact our office at [phone] option 4 or send us a message through MyChart.   We are unable to tell what your co-pay for medications will be in advance as this is different depending on your insurance coverage.  However, we may be able to find a substitute medication at lower cost or fill out paperwork to get insurance to cover a needed medication.   If a prior authorization is required to get your medication covered by your insurance company, please allow us 1-2 business days to complete this process.  Drug prices often vary depending on where the prescription is filled and some pharmacies may offer cheaper prices.  The website www.goodrx.com contains coupons for medications through different pharmacies. The prices here do not account for what the cost may be with help from insurance (it may be cheaper with your insurance), but the website can give you the price if you did not use any insurance.  - You can print the associated coupon and take it with your prescription to the pharmacy.  - You may also stop by our office during regular business hours and pick up a GoodRx coupon card.  - If you need your prescription sent electronically to a different pharmacy, notify our office through [place] MyChart or by phone at [phone] option 4.     Si Usted Necesita Algo Despus de Su Visita  Tambin puede enviarnos un mensaje a travs de MyChart. Por lo general respondemos a los mensajes de MyChart en el transcurso de 1 a 2 das hbiles.  Para renovar recetas, por favor pida a su farmacia que se ponga en contacto con nuestra oficina. Nuestro nmero de fax es el [phone].  Si tiene   un asunto urgente cuando la clnica est cerrada y que no puede esperar hasta el siguiente da hbil, puede llamar/localizar a su doctor(a) al nmero que aparece a continuacin.   Por favor, tenga en cuenta que aunque hacemos todo lo posible para estar disponibles para asuntos urgentes fuera del horario de oficina, no estamos disponibles las 24 horas del da, los 7 das de la semana.   Si tiene un problema urgente y no puede comunicarse con nosotros, puede optar por buscar atencin mdica  en el consultorio de su  doctor(a), en una clnica privada, en un centro de atencin urgente o en una sala de emergencias.  Si tiene una emergencia mdica, por favor llame inmediatamente al 911 o vaya a la sala de emergencias.  Nmeros de bper  - Dr. Kowalski: [phone]  - Dra. Moye: [phone]  - Dra. Stewart: [phone]  En caso de inclemencias del tiempo, por favor llame a nuestra lnea principal al [phone] para una actualizacin sobre el estado de cualquier retraso o cierre.  Consejos para la medicacin en dermatologa: Por favor, guarde las cajas en las que vienen los medicamentos de uso tpico para ayudarle a seguir las instrucciones sobre dnde y cmo usarlos. Las farmacias generalmente imprimen las instrucciones del medicamento slo en las cajas y no directamente en los tubos del medicamento.   Si su medicamento es muy caro, por favor, pngase en contacto con nuestra oficina llamando al [phone] y presione la opcin 4 o envenos un mensaje a travs de MyChart.   No podemos decirle cul ser su copago por los medicamentos por adelantado ya que esto es diferente dependiendo de la cobertura de su seguro. Sin embargo, es posible que podamos encontrar un medicamento sustituto a menor costo o llenar un formulario para que el seguro cubra el medicamento que se considera necesario.   Si se requiere una autorizacin previa para que su compaa de seguros cubra su medicamento, por favor permtanos de 1 a 2 das hbiles para completar este proceso.  Los precios de los medicamentos varan con frecuencia dependiendo del lugar de dnde se surte la receta y alguna farmacias pueden ofrecer precios ms baratos.  El sitio web www.goodrx.com tiene cupones para medicamentos de diferentes farmacias. Los precios aqu no tienen en cuenta lo que podra costar con la ayuda del seguro (puede ser ms barato con su seguro), pero el sitio web puede darle el precio si no utiliz ningn seguro.  - Puede imprimir el cupn  correspondiente y llevarlo con su receta a la farmacia.  - Tambin puede pasar por nuestra oficina durante el horario de atencin regular y recoger una tarjeta de cupones de GoodRx.  - Si necesita que su receta se enve electrnicamente a una farmacia diferente, informe a nuestra oficina a travs de MyChart de [place] o por telfono llamando al [phone] y presione la opcin 4.   Cryotherapy Aftercare  Wash gently with soap and water everyday.   Apply Vaseline and Band-Aid daily until healed.  

## 2021-03-01 ENCOUNTER — Encounter: Payer: Self-pay | Admitting: Dermatology

## 2021-08-31 ENCOUNTER — Encounter: Payer: Self-pay | Admitting: Dermatology

## 2021-08-31 ENCOUNTER — Ambulatory Visit (INDEPENDENT_AMBULATORY_CARE_PROVIDER_SITE_OTHER): Payer: Managed Care, Other (non HMO) | Admitting: Dermatology

## 2021-08-31 DIAGNOSIS — L820 Inflamed seborrheic keratosis: Secondary | ICD-10-CM

## 2021-08-31 DIAGNOSIS — L814 Other melanin hyperpigmentation: Secondary | ICD-10-CM

## 2021-08-31 DIAGNOSIS — L821 Other seborrheic keratosis: Secondary | ICD-10-CM

## 2021-08-31 DIAGNOSIS — Z1283 Encounter for screening for malignant neoplasm of skin: Secondary | ICD-10-CM | POA: Diagnosis not present

## 2021-08-31 DIAGNOSIS — D2239 Melanocytic nevi of other parts of face: Secondary | ICD-10-CM | POA: Diagnosis not present

## 2021-08-31 DIAGNOSIS — Z85828 Personal history of other malignant neoplasm of skin: Secondary | ICD-10-CM

## 2021-08-31 DIAGNOSIS — D229 Melanocytic nevi, unspecified: Secondary | ICD-10-CM

## 2021-08-31 DIAGNOSIS — L304 Erythema intertrigo: Secondary | ICD-10-CM

## 2021-08-31 DIAGNOSIS — Z86018 Personal history of other benign neoplasm: Secondary | ICD-10-CM

## 2021-08-31 DIAGNOSIS — L578 Other skin changes due to chronic exposure to nonionizing radiation: Secondary | ICD-10-CM

## 2021-08-31 DIAGNOSIS — Z85820 Personal history of malignant melanoma of skin: Secondary | ICD-10-CM

## 2021-08-31 DIAGNOSIS — D1800 Hemangioma unspecified site: Secondary | ICD-10-CM

## 2021-08-31 DIAGNOSIS — L918 Other hypertrophic disorders of the skin: Secondary | ICD-10-CM

## 2021-08-31 NOTE — Patient Instructions (Addendum)
Start [facility] Iodoquinol 1%, Hydrocortisone 2.5%, Niacinamide 2% Cream twice a day to affected areas for up to two weeks.  The patient was advised this is not covered by insurance since it is made by a compounding pharmacy. They will receive an email to check out and the medication will be mailed to their home.   Instructions for [facility] Medications  One or more of your medications was sent to the [facility] mail order compounding pharmacy. You will receive an email from them and can purchase the medicine through that link. It will then be mailed to your home at the address you confirmed. If for any reason you do not receive an email from them, please check your spam folder. If you still do not find the email, please let us know. [facility] phone number is [phone].   Cryotherapy Aftercare  Wash gently with soap and water everyday.   Apply Vaseline and Band-Aid daily until healed.     Melanoma ABCDEs  Melanoma is the most dangerous type of skin cancer, and is the leading cause of death from skin disease.  You are more likely to develop melanoma if you: Have light-colored skin, light-colored eyes, or red or blond hair Spend a lot of time in the sun Tan regularly, either outdoors or in a tanning bed Have had blistering sunburns, especially during childhood Have a close family member who has had a melanoma Have atypical moles or large birthmarks  Early detection of melanoma is key since treatment is typically straightforward and cure rates are extremely high if we catch it early.   The first sign of melanoma is often a change in a mole or a new dark spot.  The ABCDE system is a way of remembering the signs of melanoma.  A for asymmetry:  The two halves do not match. B for border:  The edges of the growth are irregular. C for color:  A mixture of colors are present instead of an even brown color. D for diameter:  Melanomas are usually (but not always) greater  than 42m - the size of a pencil eraser. E for evolution:  The spot keeps changing in size, shape, and color.  Please check your skin once per month between visits. You can use a small mirror in front and a large mirror behind you to keep an eye on the back side or your body.   If you see any new or changing lesions before your next follow-up, please call to schedule a visit.  Please continue daily skin protection including broad spectrum sunscreen SPF 30+ to sun-exposed areas, reapplying every 2 hours as needed when you're outdoors.   Staying in the shade or wearing long sleeves, sun glasses (UVA+UVB protection) and wide brim hats (4-inch brim around the entire circumference of the hat) are also recommended for sun protection.    Due to recent changes in healthcare laws, you may see results of your pathology and/or laboratory studies on MyChart before the doctors have had a chance to review them. We understand that in some cases there may be results that are confusing or concerning to you. Please understand that not all results are received at the same time and often the doctors may need to interpret multiple results in order to provide you with the best plan of care or course of treatment. Therefore, we ask that you please give u[place]2 business days to thoroughly review all your results before contacting the office for clarification. Should  we see a critical lab result, you will be contacted sooner.   If You Need Anything After Your Visit  If you have any questions or concerns for your doctor, please call our main line at [phone] and press option 4 to reach your doctor's medical assistant. If no one answers, please leave a voicemail as directed and we will return your call as soon as possible. Messages left after 4 pm will be answered the following business day.   You may also send [place] a message via [place]. We typically respond to MyChart messages within 1-2 business days.  For prescription  refills, please ask your pharmacy to contact our office. Our fax number is [phone].  If you have an urgent issue when the clinic is closed that cannot wait until the next business day, you can page your doctor at the number below.    Please note that while we do our best to be available for urgent issues outside of office hours, we are not available 24/7.   If you have an urgent issue and are unable to reach [place], you may choose to seek medical care at your doctor's office, retail clinic, urgent care center, or emergency room.  If you have a medical emergency, please immediately call 911 or go to the emergency department.  Pager Numbers  - Dr. Nehemiah Massed: [phone]  - Dr. Laurence Ferrari: [phone]  - Dr. Nicole Kindred: [phone]  In the event of inclement weather, please call our main line at [phone] for an update on the status of any delays or closures.  Dermatology Medication Tips: Please keep the boxes that topical medications come in in order to help keep track of the instructions about where and how to use these. Pharmacies typically print the medication instructions only on the boxes and not directly on the medication tubes.   If your medication is too expensive, please contact our office at [phone] option 4 or send [place] a message through [place].   We are unable to tell what your co-pay for medications will be in advance as this is different depending on your insurance coverage. However, we may be able to find a substitute medication at lower cost or fill out paperwork to get insurance to cover a needed medication.   If a prior authorization is required to get your medication covered by your insurance company, please allow [place] 1-2 business days to complete this process.  Drug prices often vary depending on where the prescription is filled and some pharmacies may offer cheaper prices.  The website www.goodrx.com contains coupons for medications through different pharmacies. The  prices here do not account for what the cost may be with help from insurance (it may be cheaper with your insurance), but the website can give you the price if you did not use any insurance.  - You can print the associated coupon and take it with your prescription to the pharmacy.  - You may also stop by our office during regular business hours and pick up a GoodRx coupon card.  - If you need your prescription sent electronically to a different pharmacy, notify our office through [facility] or by phone at [phone] option 4.     Si Usted Necesita Algo Despus de Su Visita  Tambin puede enviarnos un mensaje a travs de Pharmacist, community. Por lo general respondemos a los mensajes de MyChart en el transcurso de 1 a 2 das hbiles.  Para renovar recetas, por favor pida a su farmacia que se ponga en  contacto con nuestra oficina. Harland Dingwall de fax es [place] [phone].  Si tiene un asunto urgente cuando la clnica est cerrada y que no puede esperar hasta el siguiente da hbil, puede llamar/localizar a su doctor(a) al nmero que aparece a continuacin.   Por favor, tenga en cuenta que aunque hacemos todo lo posible para estar disponibles para asuntos urgentes fuera del horario de [place], no estamos disponibles las 24 horas del da, los 7 das de la [place].   Si tiene un problema urgente y no puede comunicarse con nosotros, puede optar por buscar atencin mdica  en el consultorio de su doctor(a), en una clnica privada, en un centro de atencin urgente o en una sala de emergencias.  Si tiene Engineering geologist, por favor llame inmediatamente al 911 o vaya a la sala de emergencias.  Nmeros de bper  - Dr. Nehemiah Massed: [phone]  - Dra. Moye: [phone]  - Dra. Nicole Kindred: [phone]  En caso de inclemencias del [place], por favor llame a Johnsie Kindred principal al [phone] para una actualizacin sobre el [place] de cualquier retraso o cierre.  Consejos para la medicacin en  dermatologa: Por favor, guarde las cajas en las que vienen los medicamentos de uso tpico para ayudarle a seguir las instrucciones sobre dnde y cmo usarlos. Las farmacias generalmente imprimen las instrucciones del medicamento slo en las cajas y no directamente en los tubos del [place].   Si su medicamento es muy caro, por favor, pngase en contacto con Zigmund Daniel llamando al [phone] y presione la opcin 4 o envenos un mensaje a travs de Pharmacist, community.   No podemos decirle cul ser su copago por los medicamentos por adelantado ya que esto es diferente dependiendo de la cobertura de su seguro. Sin embargo, es posible que podamos encontrar un medicamento sustituto a Electrical engineer un formulario para que el seguro cubra el medicamento que se considera necesario.   Si se requiere una autorizacin previa para que su compaa de seguros [place] su medicamento, por favor permtanos de 1 a 2 das hbiles para completar este proceso.  Los precios de los medicamentos varan con frecuencia dependiendo del Environmental consultant de dnde se surte la receta y alguna farmacias pueden ofrecer precios ms baratos.  El sitio web www.goodrx.com tiene cupones para medicamentos de Airline pilot. Los precios aqu no tienen en cuenta lo que podra costar con la ayuda del seguro (puede ser ms barato con su seguro), pero el sitio web puede darle el precio si no utiliz Research scientist (physical sciences).  - Puede imprimir el cupn correspondiente y llevarlo con su receta a la farmacia.  - Tambin puede pasar por nuestra oficina durante el horario de atencin regular y Charity fundraiser una tarjeta de cupones de GoodRx.  - Si necesita que su receta se enve electrnicamente a una farmacia diferente, informe a nuestra oficina a travs de MyChart de [place] o por telfono llamando al [phone] y presione la opcin 4.

## 2021-08-31 NOTE — Progress Notes (Signed)
Follow-Up Visit   Subjective  Randy Hahn is a 56 y.o. male who presents for the following: Annual Exam (6 month tbse, hx of melanoma, hx of bcc, hx of dysplastic nevi. Reports a spot at right forearm and chaffing in groin area he would like checked.  ). The patient presents for Total-Body Skin Exam (TBSE) for skin cancer screening and mole check.  The patient has spots, moles and lesions to be evaluated, some may be new or changing and the patient has concerns that these could be cancer.  The following portions of the chart were reviewed this encounter and updated as appropriate:  Tobacco  Allergies  Meds  Problems  Med Hx  Surg Hx  Fam Hx     Review of Systems: No other skin or systemic complaints except as noted in HPI or Assessment and Plan.  Objective  Well appearing patient in no apparent distress; mood and affect are within normal limits.  A full examination was performed including scalp, head, eyes, ears, nose, lips, neck, chest, axillae, abdomen, back, buttocks, bilateral upper extremities, bilateral lower extremities, hands, feet, fingers, toes, fingernails, and toenails. All findings within normal limits unless otherwise noted below.  right forearm x 2 (2) Erythematous stuck-on, waxy papule or plaque  groin Erythema and maceration of groin  mid dorsum nose 0.4cm flesh pap      Assessment & Plan  Lentigines - Scattered tan macules - Due to sun exposure - Benign-appearing, observe - Recommend daily broad spectrum sunscreen SPF 30+ to sun-exposed areas, reapply every 2 hours as needed. - Call for any changes  Inflamed seborrheic keratosis (2) right forearm x 2 Symptomatic, irritating, patient would like treated. Destruction of lesion - right forearm x 2 Complexity: simple   Destruction method: cryotherapy   Informed consent: discussed and consent obtained   Timeout:  patient name, date of birth, surgical site, and procedure verified Lesion  destroyed using liquid nitrogen: Yes   Region frozen until ice ball extended beyond lesion: Yes   Outcome: patient tolerated procedure well with no complications   Post-procedure details: wound care instructions given   Additional details:  Prior to procedure, discussed risks of blister formation, small wound, skin dyspigmentation, or rare scar following cryotherapy. Recommend Vaseline ointment to treated areas while healing.  Erythema intertrigo groin Intertrigo is a chronic recurrent rash that occurs in skin fold areas that may be associated with friction; heat; moisture; yeast; fungus; and bacteria.  It is exacerbated by increased movement / activity; sweating; and higher atmospheric temperature.  Start [facility] Iodoquinol 1%, Hydrocortisone 2.5%, Niacinamide 2% Cream twice a day to affected areas for up to two weeks.  The patient was advised this is not covered by insurance since it is made by a compounding pharmacy. They will receive an email to check out and the medication will be mailed to their home.   Instructions for [facility] Medications  One or more of your medications was sent to the [facility] mail order compounding pharmacy. You will receive an email from them and can purchase the medicine through that link. It will then be mailed to your home at the address you confirmed. If for any reason you do not receive an email from them, please check your spam folder. If you still do not find the email, please let us know. [facility] phone number is [phone].  Fibrous papule of nose vs nevus No change - present for years. mid dorsum nose See photo Benign-appearing.  Observation.  Call clinic for new or changing moles.  Recommend daily use of broad spectrum spf 30+ sunscreen to sun-exposed areas.    Skin cancer screening  Acrochordons (Skin Tags) - Fleshy, skin-colored pedunculated papules - Benign appearing.  - Observe. - If desired, they can be removed  with an in office procedure that is not covered by insurance. - Please call the clinic if you notice any new or changing lesions.  Seborrheic Keratoses - Stuck-on, waxy, tan-brown papules and/or plaques  - Benign-appearing - Discussed benign etiology and prognosis. - Observe - Call for any changes  Melanocytic Nevi - Tan-brown and/or pink-flesh-colored symmetric macules and papules - Benign appearing on exam today - Observation - Call clinic for new or changing moles - Recommend daily use of broad spectrum spf 30+ sunscreen to sun-exposed areas.   Hemangiomas - Red papules - Discussed benign nature - Observe - Call for any changes  Actinic Damage - Chronic condition, secondary to cumulative UV/sun exposure - diffuse scaly erythematous macules with underlying dyspigmentation - Recommend daily broad spectrum sunscreen SPF 30+ to sun-exposed areas, reapply every 2 hours as needed.  - Staying in the shade or wearing long sleeves, sun glasses (UVA+UVB protection) and wide brim hats (4-inch brim around the entire circumference of the hat) are also recommended for sun protection.  - Call for new or changing lesions.  History of Dysplastic Nevi - No evidence of recurrence today - Recommend regular full body skin exams - Recommend daily broad spectrum sunscreen SPF 30+ to sun-exposed areas, reapply every 2 hours as needed.  - Call if any new or changing lesions are noted between office visits  History of Basal Cell Carcinoma of the Skin at right lateral deltoid 2012 - No evidence of recurrence today - Recommend regular full body skin exams - Recommend daily broad spectrum sunscreen SPF 30+ to sun-exposed areas, reapply every 2 hours as needed.  - Call if any new or changing lesions are noted between office visits  History of Melanoma - R med ant ankle above the med malleous. Superficial spreading. Clark's level II/III. Breslow's 0.5m (02/14/2018) [place] testing = low risk - No  evidence of recurrence today - No lymphadenopathy - Recommend regular full body skin exams - Recommend daily broad spectrum sunscreen SPF 30+ to sun-exposed areas, reapply every 2 hours as needed.  - Call if any new or changing lesions are noted between office visits  Skin cancer screening performed today. Return in about 6 months (around 03/03/2022) for TBSE. I,Ruthell Rummage CMA, am acting as scribe for DSarina Ser MD. Documentation: I have reviewed the above documentation for accuracy and completeness, and I agree with the above.  DSarina Ser MD

## 2022-02-05 ENCOUNTER — Other Ambulatory Visit: Payer: Self-pay | Admitting: Internal Medicine

## 2022-02-05 DIAGNOSIS — E1165 Type 2 diabetes mellitus with hyperglycemia: Secondary | ICD-10-CM

## 2022-02-05 DIAGNOSIS — R7989 Other specified abnormal findings of blood chemistry: Secondary | ICD-10-CM

## 2022-02-17 ENCOUNTER — Ambulatory Visit
Admission: RE | Admit: 2022-02-17 | Discharge: 2022-02-17 | Disposition: A | Discharge status: Home / Self Care | Payer: Managed Care, Other (non HMO) | Source: Ambulatory Visit | Attending: Internal Medicine | Admitting: Internal Medicine

## 2022-02-17 DIAGNOSIS — E1165 Type 2 diabetes mellitus with hyperglycemia: Secondary | ICD-10-CM | POA: Insufficient documentation

## 2022-02-17 DIAGNOSIS — R7989 Other specified abnormal findings of blood chemistry: Secondary | ICD-10-CM | POA: Diagnosis present

## 2022-03-03 ENCOUNTER — Ambulatory Visit: Payer: Managed Care, Other (non HMO) | Admitting: Dermatology

## 2022-03-03 VITALS — BP 138/92 | HR 85

## 2022-03-03 DIAGNOSIS — Z85820 Personal history of malignant melanoma of skin: Secondary | ICD-10-CM

## 2022-03-03 DIAGNOSIS — Z1283 Encounter for screening for malignant neoplasm of skin: Secondary | ICD-10-CM | POA: Diagnosis not present

## 2022-03-03 DIAGNOSIS — L821 Other seborrheic keratosis: Secondary | ICD-10-CM

## 2022-03-03 DIAGNOSIS — Z85828 Personal history of other malignant neoplasm of skin: Secondary | ICD-10-CM

## 2022-03-03 DIAGNOSIS — L578 Other skin changes due to chronic exposure to nonionizing radiation: Secondary | ICD-10-CM

## 2022-03-03 DIAGNOSIS — D229 Melanocytic nevi, unspecified: Secondary | ICD-10-CM

## 2022-03-03 DIAGNOSIS — D225 Melanocytic nevi of trunk: Secondary | ICD-10-CM

## 2022-03-03 DIAGNOSIS — L814 Other melanin hyperpigmentation: Secondary | ICD-10-CM

## 2022-03-03 DIAGNOSIS — Z86018 Personal history of other benign neoplasm: Secondary | ICD-10-CM

## 2022-03-03 DIAGNOSIS — L918 Other hypertrophic disorders of the skin: Secondary | ICD-10-CM

## 2022-03-03 NOTE — Progress Notes (Signed)
Follow-Up Visit   Subjective  Randy Hahn is a 57 y.o. male who presents for the following: Total body skin exam (Hx of Melanoma R med ant ankle above the med malleous. Superficial spreading. Clark's level II/III. Breslow's 0.77m 02/2018, hx of Dysplastic Nevi). The patient presents for Total-Body Skin Exam (TBSE) for skin cancer screening and mole check.  The patient has spots, moles and lesions to be evaluated, some may be new or changing and the patient has concerns that these could be cancer.   The following portions of the chart were reviewed this encounter and updated as appropriate:   Tobacco  Allergies  Meds  Problems  Med Hx  Surg Hx  Fam Hx     Review of Systems:  No other skin or systemic complaints except as noted in HPI or Assessment and Plan.  Objective  Well appearing patient in no apparent distress; mood and affect are within normal limits.  A full examination was performed including scalp, head, eyes, ears, nose, lips, neck, chest, axillae, abdomen, back, buttocks, bilateral upper extremities, bilateral lower extremities, hands, feet, fingers, toes, fingernails, and toenails. All findings within normal limits unless otherwise noted below.  R med ant ankle above the med malleous Well healed scar with no evidence of recurrence, no lymphadenopathy.    Assessment & Plan   Lentigines - Scattered tan macules - Due to sun exposure - Benign-appearing, observe - Recommend daily broad spectrum sunscreen SPF 30+ to sun-exposed areas, reapply every 2 hours as needed. - Call for any changes - back  Seborrheic Keratoses - Stuck-on, waxy, tan-brown papules and/or plaques  - Benign-appearing - Discussed benign etiology and prognosis. - Observe - Call for any changes - back  Melanocytic Nevi - Tan-brown and/or pink-flesh-colored symmetric macules and papules - Benign appearing on exam today - Observation - Call clinic for new or changing moles - Recommend  daily use of broad spectrum spf 30+ sunscreen to sun-exposed areas.  - back  Hemangiomas - Red papules - Discussed benign nature - Observe - Call for any changes - trunk  Actinic Damage - Chronic condition, secondary to cumulative UV/sun exposure - diffuse scaly erythematous macules with underlying dyspigmentation - Recommend daily broad spectrum sunscreen SPF 30+ to sun-exposed areas, reapply every 2 hours as needed.  - Staying in the shade or wearing long sleeves, sun glasses (UVA+UVB protection) and wide brim hats (4-inch brim around the entire circumference of the hat) are also recommended for sun protection.  - Call for new or changing lesions.  Skin cancer screening performed today.   History of Basal Cell Carcinoma of the Skin - No evidence of recurrence today - Recommend regular full body skin exams - Recommend daily broad spectrum sunscreen SPF 30+ to sun-exposed areas, reapply every 2 hours as needed.  - Call if any new or changing lesions are noted between office visits  - R lat deltoid  History of Dysplastic Nevi - No evidence of recurrence today - Recommend regular full body skin exams - Recommend daily broad spectrum sunscreen SPF 30+ to sun-exposed areas, reapply every 2 hours as needed.  - Call if any new or changing lesions are noted between office visits  - R post deltoid, R lat upper back, L mid to sup scapula, R lat costal margin near side  Acrochordons (Skin Tags) - Fleshy, skin-colored pedunculated papules - Benign appearing.  - Observe. - If desired, they can be removed with an in office procedure that is not covered  by insurance. - Please call the clinic if you notice any new or changing lesions.   History of melanoma R med ant ankle above the med malleous Clark's level II/III. Breslow's 0.12m.  Excised 02/2018 Clear.  No lymphadenopathy.  Observe for recurrence. Call clinic for new or changing lesions.  Recommend regular skin exams, daily  broad-spectrum spf 30+ sunscreen use, and photoprotection.    Return in about 6 months (around 09/01/2022) for Hx of Melanoma, Hx of Dysplastic nevi.  I, SOthelia Pulling RMA, am acting as scribe for DSarina Ser MD . Documentation: I have reviewed the above documentation for accuracy and completeness, and I agree with the above.  DSarina Ser MD

## 2022-03-03 NOTE — Patient Instructions (Signed)
Due to recent changes in healthcare laws, you may see results of your pathology and/or laboratory studies on MyChart before the doctors have had a chance to review them. We understand that in some cases there may be results that are confusing or concerning to you. Please understand that not all results are received at the same time and often the doctors may need to interpret multiple results in order to provide you with the best plan of care or course of treatment. Therefore, we ask that you please give us 2 business days to thoroughly review all your results before contacting the office for clarification. Should we see a critical lab result, you will be contacted sooner.   If You Need Anything After Your Visit  If you have any questions or concerns for your doctor, please call our main line at [phone] and press option 4 to reach your doctor's medical assistant. If no one answers, please leave a voicemail as directed and we will return your call as soon as possible. Messages left after 4 pm will be answered the following business day.   You may also send us a message via MyChart. We typically respond to MyChart messages within 1-2 business days.  For prescription refills, please ask your pharmacy to contact our office. Our fax number is [phone].  If you have an urgent issue when the clinic is closed that cannot wait until the next business day, you can page your doctor at the number below.    Please note that while we do our best to be available for urgent issues outside of office hours, we are not available 24/7.   If you have an urgent issue and are unable to reach us, you may choose to seek medical care at your doctor's office, retail clinic, urgent care center, or emergency room.  If you have a medical emergency, please immediately call 911 or go to the emergency department.  Pager Numbers  - Dr. Kowalski: [phone]  - Dr. Moye: [phone]  - Dr. Stewart:  [phone]  In the event of inclement weather, please call our main line at [phone] for an update on the status of any delays or closures.  Dermatology Medication Tips: Please keep the boxes that topical medications come in in order to help keep track of the instructions about where and how to use these. Pharmacies typically print the medication instructions only on the boxes and not directly on the medication tubes.   If your medication is too expensive, please contact our office at [phone] option 4 or send us a message through MyChart.   We are unable to tell what your co-pay for medications will be in advance as this is different depending on your insurance coverage. However, we may be able to find a substitute medication at lower cost or fill out paperwork to get insurance to cover a needed medication.   If a prior authorization is required to get your medication covered by your insurance company, please allow us 1-2 business days to complete this process.  Drug prices often vary depending on where the prescription is filled and some pharmacies may offer cheaper prices.  The website www.goodrx.com contains coupons for medications through different pharmacies. The prices here do not account for what the cost may be with help from insurance (it may be cheaper with your insurance), but the website can give you the price if you did not use any insurance.  - You can print the associated coupon and take it with   your prescription to the pharmacy.  - You may also stop by our office during regular business hours and pick up a GoodRx coupon card.  - If you need your prescription sent electronically to a different pharmacy, notify our office through [facility] MyChart or by phone at [phone] option 4.     Si Usted Necesita Algo Despus de Su Visita  Tambin puede enviarnos un mensaje a travs de MyChart. Por lo general respondemos a los mensajes de MyChart en el transcurso de 1 a 2  das hbiles.  Para renovar recetas, por favor pida a su farmacia que se ponga en contacto con nuestra oficina. Nuestro nmero de fax es el [phone].  Si tiene un asunto urgente cuando la clnica est cerrada y que no puede esperar hasta el siguiente da hbil, puede llamar/localizar a su doctor(a) al nmero que aparece a continuacin.   Por favor, tenga en cuenta que aunque hacemos todo lo posible para estar disponibles para asuntos urgentes fuera del horario de oficina, no estamos disponibles las 24 horas del da, los 7 das de la semana.   Si tiene un problema urgente y no puede comunicarse con nosotros, puede optar por buscar atencin mdica  en el consultorio de su doctor(a), en una clnica privada, en un centro de atencin urgente o en una sala de emergencias.  Si tiene una emergencia mdica, por favor llame inmediatamente al 911 o vaya a la sala de emergencias.  Nmeros de bper  - Dr. Kowalski: [phone]  - Dra. Moye: [phone]  - Dra. Stewart: [phone]  En caso de inclemencias del tiempo, por favor llame a nuestra lnea principal al [phone] para una actualizacin sobre el estado de cualquier retraso o cierre.  Consejos para la medicacin en dermatologa: Por favor, guarde las cajas en las que vienen los medicamentos de uso tpico para ayudarle a seguir las instrucciones sobre dnde y cmo usarlos. Las farmacias generalmente imprimen las instrucciones del medicamento slo en las cajas y no directamente en los tubos del medicamento.   Si su medicamento es muy caro, por favor, pngase en contacto con nuestra oficina llamando al [phone] y presione la opcin 4 o envenos un mensaje a travs de MyChart.   No podemos decirle cul ser su copago por los medicamentos por adelantado ya que esto es diferente dependiendo de la cobertura de su seguro. Sin embargo, es posible que podamos encontrar un medicamento sustituto a menor costo o llenar un formulario para que el  seguro cubra el medicamento que se considera necesario.   Si se requiere una autorizacin previa para que su compaa de seguros cubra su medicamento, por favor permtanos de 1 a 2 das hbiles para completar este proceso.  Los precios de los medicamentos varan con frecuencia dependiendo del lugar de dnde se surte la receta y alguna farmacias pueden ofrecer precios ms baratos.  El sitio web www.goodrx.com tiene cupones para medicamentos de diferentes farmacias. Los precios aqu no tienen en cuenta lo que podra costar con la ayuda del seguro (puede ser ms barato con su seguro), pero el sitio web puede darle el precio si no utiliz ningn seguro.  - Puede imprimir el cupn correspondiente y llevarlo con su receta a la farmacia.  - Tambin puede pasar por nuestra oficina durante el horario de atencin regular y recoger una tarjeta de cupones de GoodRx.  - Si necesita que su receta se enve electrnicamente a una farmacia diferente, informe a nuestra oficina a travs de MyChart de [place]   o por telfono llamando al [phone] y presione la opcin 4.  

## 2022-03-06 ENCOUNTER — Encounter: Payer: Self-pay | Admitting: Dermatology

## 2022-06-24 ENCOUNTER — Other Ambulatory Visit: Payer: Self-pay | Admitting: General Surgery

## 2022-06-24 DIAGNOSIS — K824 Cholesterolosis of gallbladder: Secondary | ICD-10-CM

## 2022-08-12 ENCOUNTER — Ambulatory Visit: Payer: No Typology Code available for payment source | Admitting: Dermatology

## 2022-09-09 ENCOUNTER — Encounter: Payer: Self-pay | Admitting: Dermatology

## 2022-09-09 ENCOUNTER — Ambulatory Visit: Payer: Managed Care, Other (non HMO) | Admitting: Dermatology

## 2022-09-09 VITALS — BP 131/84

## 2022-09-09 DIAGNOSIS — L820 Inflamed seborrheic keratosis: Secondary | ICD-10-CM | POA: Diagnosis not present

## 2022-09-09 DIAGNOSIS — L821 Other seborrheic keratosis: Secondary | ICD-10-CM

## 2022-09-09 DIAGNOSIS — L578 Other skin changes due to chronic exposure to nonionizing radiation: Secondary | ICD-10-CM

## 2022-09-09 DIAGNOSIS — L570 Actinic keratosis: Secondary | ICD-10-CM

## 2022-09-09 DIAGNOSIS — Z85820 Personal history of malignant melanoma of skin: Secondary | ICD-10-CM

## 2022-09-09 DIAGNOSIS — Z85828 Personal history of other malignant neoplasm of skin: Secondary | ICD-10-CM

## 2022-09-09 DIAGNOSIS — Z86018 Personal history of other benign neoplasm: Secondary | ICD-10-CM

## 2022-09-09 DIAGNOSIS — D229 Melanocytic nevi, unspecified: Secondary | ICD-10-CM

## 2022-09-09 DIAGNOSIS — Z1283 Encounter for screening for malignant neoplasm of skin: Secondary | ICD-10-CM | POA: Diagnosis not present

## 2022-09-09 DIAGNOSIS — W908XXA Exposure to other nonionizing radiation, initial encounter: Secondary | ICD-10-CM | POA: Diagnosis not present

## 2022-09-09 DIAGNOSIS — L814 Other melanin hyperpigmentation: Secondary | ICD-10-CM

## 2022-09-09 DIAGNOSIS — D1801 Hemangioma of skin and subcutaneous tissue: Secondary | ICD-10-CM

## 2022-09-09 NOTE — Patient Instructions (Signed)

## 2022-09-09 NOTE — Progress Notes (Signed)
Follow-Up Visit   Subjective  Randy Hahn is a 57 y.o. male who presents for the following: Skin Cancer Screening and Full Body Skin Exam - History of Melanoma, BCC, Dysplastic Nevi  The patient presents for Total-Body Skin Exam (TBSE) for skin cancer screening and mole check. The patient has spots, moles and lesions to be evaluated, some may be new or changing and the patient may have concern these could be cancer.    The following portions of the chart were reviewed this encounter and updated as appropriate: medications, allergies, medical history  Review of Systems:  No other skin or systemic complaints except as noted in HPI or Assessment and Plan.  Objective  Well appearing patient in no apparent distress; mood and affect are within normal limits.  A full examination was performed including scalp, head, eyes, ears, nose, lips, neck, chest, axillae, abdomen, back, buttocks, bilateral upper extremities, bilateral lower extremities, hands, feet, fingers, toes, fingernails, and toenails. All findings within normal limits unless otherwise noted below.   Relevant physical exam findings are noted in the Assessment and Plan.  Right mandible x 1, right upper lip x 1, abdomen x 1, right calf x 1, right medial ankle x 3 (7) Erythematous stuck-on, waxy papule or plaque  Right Ear Erythematous thin papules/macules with gritty scale.     Assessment & Plan   HISTORY OF MELANOMA - No evidence of recurrence today - No lymphadenopathy - Recommend regular full body skin exams - Recommend daily broad spectrum sunscreen SPF 30+ to sun-exposed areas, reapply every 2 hours as needed.  - Call if any new or changing lesions are noted between office visits  HISTORY OF BASAL CELL CARCINOMA OF THE SKIN - No evidence of recurrence today - Recommend regular full body skin exams - Recommend daily broad spectrum sunscreen SPF 30+ to sun-exposed areas, reapply every 2 hours as needed.  - Call  if any new or changing lesions are noted between office visits  History of Dysplastic Nevi - No evidence of recurrence today - Recommend regular full body skin exams - Recommend daily broad spectrum sunscreen SPF 30+ to sun-exposed areas, reapply every 2 hours as needed.  - Call if any new or changing lesions are noted between office visits   SKIN CANCER SCREENING PERFORMED TODAY.  ACTINIC DAMAGE - Chronic condition, secondary to cumulative UV/sun exposure - diffuse scaly erythematous macules with underlying dyspigmentation - Recommend daily broad spectrum sunscreen SPF 30+ to sun-exposed areas, reapply every 2 hours as needed.  - Staying in the shade or wearing long sleeves, sun glasses (UVA+UVB protection) and wide brim hats (4-inch brim around the entire circumference of the hat) are also recommended for sun protection.  - Call for new or changing lesions.  LENTIGINES, SEBORRHEIC KERATOSES, HEMANGIOMAS - Benign normal skin lesions - Benign-appearing - Call for any changes  MELANOCYTIC NEVI - Tan-brown and/or pink-flesh-colored symmetric macules and papules - Benign appearing on exam today - Observation - Call clinic for new or changing moles - Recommend daily use of broad spectrum spf 30+ sunscreen to sun-exposed areas.       Inflamed seborrheic keratosis (7) Right mandible x 1, right upper lip x 1, abdomen x 1, right calf x 1, right medial ankle x 3  Symptomatic, irritating, patient would like treated.  Benign-appearing.  Call clinic for new or changing lesions.    Destruction of lesion - Right mandible x 1, right upper lip x 1, abdomen x 1, right calf x  1, right medial ankle x 3 (7) Complexity: simple   Destruction method: cryotherapy   Informed consent: discussed and consent obtained   Timeout:  patient name, date of birth, surgical site, and procedure verified Lesion destroyed using liquid nitrogen: Yes   Region frozen until ice ball extended beyond lesion: Yes    Outcome: patient tolerated procedure well with no complications   Post-procedure details: wound care instructions given    AK (actinic keratosis) Right Ear  Destruction of lesion - Right Ear Complexity: simple   Destruction method: cryotherapy   Informed consent: discussed and consent obtained   Timeout:  patient name, date of birth, surgical site, and procedure verified Lesion destroyed using liquid nitrogen: Yes   Region frozen until ice ball extended beyond lesion: Yes   Outcome: patient tolerated procedure well with no complications   Post-procedure details: wound care instructions given     Return in about 6 months (around 03/12/2023) for TBSE history of melanoma.  I, Joanie Coddington, CMA, am acting as scribe for Armida Sans, MD .   Documentation: I have reviewed the above documentation for accuracy and completeness, and I agree with the above.  Armida Sans, MD

## 2022-09-19 ENCOUNTER — Encounter: Payer: Self-pay | Admitting: Dermatology

## 2022-10-27 ENCOUNTER — Ambulatory Visit: Payer: Self-pay

## 2022-10-27 NOTE — Progress Notes (Signed)
Nutrition: 10/27/2022  CC: I want to make changes to my diet and eat better and to get better control of my blood sugar.  HX: -HT: 6'0 WT: 275 BMI: 36.76 -Dx with type 2 DM in 2018.  Attended diabetes classes at [facility]. - Hx of GERD, HTN, gout, cancer. - Meds include Benacar, Metformin, Glipizide, and Rybelsus. -A1C in June 2024 was 7.0%  -Reports that current fasting glucose levels are at 160 at times. 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 -Gets a 20 minute break at 5:00 pm -Currently not exercising. But will park the bus and will walk to a restroom further away. - Currently working later, eating later and going to bed earlier.  Diet Recall; - 7-8:00 am takes all of his DM medications. - 8:00 has a [place] sausage breakfast sandwich,and 2 slices of bacon and a diet Coke. - 11:30-12:30 lunch: Wraps (low carb wrap) of ham or [place] with cheese and mayo or mustard with lettuce.  Likes crunchy foods.  Has used chips in the past.  Now using a lower fat/carb version of a cracker. Has diet soda or water. - Drinks ice water while driving. -Sometimes if he has episode of nausea, he will have PB crackers, banana, apple or yogurt.  This is the main symptom of a low blood glucose for him.  Currently he is symptomatic in the 120's at times as opposed to the 60's in the past. - 7:00 pm is dinner.  He and his wife are each on their own with his later schedule.  Last evening he had a toss salad with dressing and his crackers.  Later was craving food and had a low sugar yogurt.  Recommendations: -Continue to take your prescribed medications.  But take the Metformin with the largest meal.  Probably the mid-day meal. -Decrease the bacon serving sizes. Increase the lunch meal portion of protein and a some carb,  Continue to use the low carb wraps. - Increase your water intake  each day. -Exercise/walk 5 days per week or 150 minutes per week. -If you have more than one carb per meal.  Make that only 1 starchy carb serving per meal. -Continue the non-caloric beverages.  Goal: Follow a healthier low carb diet and lose weight.  Eduction tools: Food/[employer].   Exercise/Activity handout.  Prediabetes handout.  Follow-up:  November  Maggie Journey [place], RN, RD, LDN

## 2023-02-22 ENCOUNTER — Ambulatory Visit: Payer: Self-pay

## 2023-03-23 ENCOUNTER — Ambulatory Visit: Payer: No Typology Code available for payment source | Admitting: Dermatology

## 2023-03-23 ENCOUNTER — Ambulatory Visit: Payer: Self-pay

## 2023-03-31 ENCOUNTER — Ambulatory Visit: Payer: Self-pay

## 2023-03-31 NOTE — Progress Notes (Signed)
 Nutrition: 03/31/2023  CC: Following up regarding my diabetes and the events that have occurred.  HX: Diagnosed with type 2 DM in 2018. Received diabetes self-management education at [facility].  Seen at [facility] 10/27/22. At that time was weighing 275 and A1C was at 7.0%.  During the fall months he gained more weight to greater than 280 lbs.  On seeing his MD, his Alc was 7.9% and his weight was 280 lbs.  His physician changed his meds at that time. The  oral Rybelsus was changed to the injectable Ozempic. He reports he continues to take his Metformin 500 mg AM and PM along with Glipizide 20 mg/d and Ozempic 5 mg weekly on Fridays. Reports starting the medication the first of January.  Since then during February, experienced RSV and the common flu.  He currently gaining his strength and appetite back.  On starting Ozempic, he has experiences a decrease in his appetite. He is eating smaller portions, He has decreased his quantity of food intake.  Instead of 2 sandwiches he eats 1. Some days he Lucah Petta"make myself eat 1/2 of a sandwich.  He is not binge eating or having cravings in the evenings.  He eats dinner at 7:30 and has no further food intake.  Reports eating and drinking according to his work schedule.  He eats fruits at work, primarily berries, sometimes a banana.  He is trying to increase his water intake and limit his diet soda intake.  He his trying to walk during his two 20 minute breaks while at work.  Reports his fasting glucose levels are down from 200 mg/dl to 213-086 mg/dl.  His weight is currently at 265 lbs. And BMI at 35.94.  Recommendations: Continue to walk on your 20 minute breaks.  Continue to aim for 150 minutes of walking per week.  Keep your blood glucose meter handy with exercise and be aware of the S/S of low blood glucose and how to appropriately treat low blood glucose. Recuperate from your RSV and Flu then advance your walking speed.    Consider walking a trail on the weekend along with the 150 minutes of walking during the week.  Keep a record of your blood glucose readings. If heat or cold are an issue, consider walking in doors. Remember that a glucose reading of 100 at this time Diandra Cimini feel like a reading of 70-80 for you.  Instead of glucose treat this with some protein and a small amount of carb. Personal Goal: "I want to lose more weight to be less that 250 lbs."  Education Materials: "Diabetes Hypoglycemia (low blood sugar)."  Follow-up: As you need.  For any and all questions.  Follow-up for weight loss progress.  Call [facility] for an appointment.

## 2023-07-21 ENCOUNTER — Ambulatory Visit: Payer: No Typology Code available for payment source | Admitting: Dermatology

## 2023-08-17 ENCOUNTER — Other Ambulatory Visit: Payer: Self-pay | Admitting: Ophthalmology

## 2023-08-17 ENCOUNTER — Other Ambulatory Visit
Admission: RE | Admit: 2023-08-17 | Discharge: 2023-08-17 | Disposition: A | Discharge status: Home / Self Care | Source: Ambulatory Visit | Attending: Ophthalmology | Admitting: Ophthalmology

## 2023-08-17 ENCOUNTER — Ambulatory Visit
Admission: RE | Admit: 2023-08-17 | Discharge: 2023-08-17 | Disposition: A | Discharge status: Home / Self Care | Source: Ambulatory Visit | Attending: Ophthalmology | Admitting: Ophthalmology

## 2023-08-17 DIAGNOSIS — R42 Dizziness and giddiness: Secondary | ICD-10-CM | POA: Insufficient documentation

## 2023-08-17 DIAGNOSIS — H532 Diplopia: Secondary | ICD-10-CM | POA: Diagnosis present

## 2023-08-17 LAB — SEDIMENTATION RATE: Sed Rate: 25 mm/h — ABNORMAL HIGH (ref 0–20)

## 2023-08-17 LAB — C-REACTIVE PROTEIN: CRP: 1.4 mg/dL — ABNORMAL HIGH (ref <1.0)

## 2023-08-17 MED ORDER — GADOBUTROL 1 MMOL/ML IV SOLN
10.0000 mL | Freq: Once | INTRAVENOUS | Status: AC | PRN
  Administered 2023-08-17: 10 mL via INTRAVENOUS
